# Patient Record
Sex: Female | Born: 1995 | Race: Black or African American | Hispanic: No | Marital: Single | Smoking: Current every day smoker
Health system: Southern US, Community
[De-identification: ages and names within clinical notes are randomized; demographics above are authoritative.]

## PROBLEM LIST (undated history)

## (undated) DIAGNOSIS — N6019 Diffuse cystic mastopathy of unspecified breast: Principal | ICD-10-CM

## (undated) DIAGNOSIS — Z113 Encounter for screening for infections with a predominantly sexual mode of transmission: Secondary | ICD-10-CM

## (undated) DIAGNOSIS — R51 Headache: Secondary | ICD-10-CM

## (undated) DIAGNOSIS — I10 Essential (primary) hypertension: Secondary | ICD-10-CM

## (undated) DIAGNOSIS — N898 Other specified noninflammatory disorders of vagina: Secondary | ICD-10-CM

## (undated) DIAGNOSIS — T1490XA Injury, unspecified, initial encounter: Secondary | ICD-10-CM

## (undated) DIAGNOSIS — Z3046 Encounter for surveillance of implantable subdermal contraceptive: Secondary | ICD-10-CM

## (undated) DIAGNOSIS — N926 Irregular menstruation, unspecified: Secondary | ICD-10-CM

## (undated) DIAGNOSIS — B9689 Other specified bacterial agents as the cause of diseases classified elsewhere: Secondary | ICD-10-CM

## (undated) DIAGNOSIS — N760 Acute vaginitis: Principal | ICD-10-CM

## (undated) DIAGNOSIS — Z309 Encounter for contraceptive management, unspecified: Secondary | ICD-10-CM

## (undated) DIAGNOSIS — B009 Herpesviral infection, unspecified: Secondary | ICD-10-CM

## (undated) DIAGNOSIS — N63 Unspecified lump in unspecified breast: Secondary | ICD-10-CM

## (undated) DIAGNOSIS — D249 Benign neoplasm of unspecified breast: Secondary | ICD-10-CM

## (undated) HISTORY — DX: Unspecified lump in unspecified breast: N63.0

## (undated) HISTORY — DX: Injury, unspecified, initial encounter: T14.90XA

## (undated) HISTORY — DX: Benign neoplasm of unspecified breast: D24.9

## (undated) HISTORY — DX: Irregular menstruation, unspecified: N92.6

## (undated) HISTORY — DX: Herpesviral infection, unspecified: B00.9

## (undated) HISTORY — DX: Other specified noninflammatory disorders of vagina: N89.8

## (undated) HISTORY — DX: Encounter for contraceptive management, unspecified: Z30.9

## (undated) HISTORY — PX: WISDOM TOOTH EXTRACTION: SHX21

## (undated) HISTORY — DX: Acute vaginitis: N76.0

## (undated) HISTORY — DX: Encounter for screening for infections with a predominantly sexual mode of transmission: Z11.3

## (undated) HISTORY — DX: Diffuse cystic mastopathy of unspecified breast: N60.19

## (undated) HISTORY — DX: Headache: R51

## (undated) HISTORY — DX: Other specified bacterial agents as the cause of diseases classified elsewhere: B96.89

## (undated) HISTORY — DX: Encounter for surveillance of implantable subdermal contraceptive: Z30.46

---
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
## 2013-04-06 ENCOUNTER — Encounter: Payer: Self-pay | Admitting: Adult Health

## 2013-04-06 ENCOUNTER — Encounter (INDEPENDENT_AMBULATORY_CARE_PROVIDER_SITE_OTHER): Payer: Self-pay

## 2013-04-06 ENCOUNTER — Ambulatory Visit (INDEPENDENT_AMBULATORY_CARE_PROVIDER_SITE_OTHER): Payer: BC Managed Care – PPO | Admitting: Adult Health

## 2013-04-06 VITALS — BP 100/60 | Ht 62.0 in | Wt 131.0 lb

## 2013-04-06 DIAGNOSIS — N63 Unspecified lump in unspecified breast: Secondary | ICD-10-CM | POA: Insufficient documentation

## 2013-04-06 DIAGNOSIS — N6019 Diffuse cystic mastopathy of unspecified breast: Secondary | ICD-10-CM

## 2013-04-06 DIAGNOSIS — R928 Other abnormal and inconclusive findings on diagnostic imaging of breast: Secondary | ICD-10-CM

## 2013-04-06 HISTORY — DX: Diffuse cystic mastopathy of unspecified breast: N60.19

## 2013-04-06 HISTORY — DX: Unspecified lump in unspecified breast: N63.0

## 2013-04-06 NOTE — Patient Instructions (Signed)
Fibrocystic Breast Changes Fibrocystic breast changes occur when breast ducts become blocked, causing painful, fluid-filled lumps (cysts) to form in the breast. This is a common condition that is noncancerous (benign). It occurs when women go through hormonal changes during their menstrual cycle. Fibrocystic breast changes can affect one or both breasts. CAUSES  The exact cause of fibrocystic breast changes is not known, but it may be related to the female hormones, estrogen and progesterone. Family traits that get passed from parent to child (genetics) may also be a factor in some cases. SIGNS AND SYMPTOMS   Tenderness, mild discomfort, or pain.   Swelling.   Ropelike feeling when touching the breast.   Lumpy breast, one or both sides.   Changes in breast size, especially before (larger) and after (smaller) the menstrual period.   Green or dark brown nipple discharge (not blood).  Symptoms are usually worse before menstrual periods start and get better toward the end of the menstrual period.  DIAGNOSIS  To make a diagnosis, your health care provider will ask you questions and perform a physical exam of your breasts. The health care provider may recommend other tests that can examine inside your breasts, such as:  A breast X-ray (mammogram).   Ultrasonography.  An MRI.  If something more than fibrocystic breast changes is suspected, your health care provider may take a breast tissue sample (breast biopsy) to examine. TREATMENT  Often, treatment is not needed. Your health care provider may recommend over-the-counter pain relievers to help lessen pain or discomfort caused by the fibrocystic breast changes. You may also be asked to change your diet to limit or stop eating foods or drinking beverages that contain caffeine. Foods and beverages that contain caffeine include chocolate, soda, coffee, and tea. Reducing sugar and fat in your diet may also help. Your health care provider  may also recommend:  Fine needle aspiration to remove fluid from a cyst that is causing pain.   Surgery to remove a large, persistent, and tender cyst. HOME CARE INSTRUCTIONS   Examine your breasts after every menstrual period. If you do not have menstrual periods, check your breasts the first day of every month. Feel for changes, such as more tenderness, a new growth, a change in breast size, or a change in a lump that has always been there.   Only take over-the-counter or prescription medicine as directed by your health care provider.   Wear a well-fitted support or sports bra, especially when exercising.   Decrease or avoid caffeine, fat, and sugar in your diet as directed by your health care provider.  SEEK MEDICAL CARE IF:   You have fluid leaking (discharge) from your nipples, especially bloody discharge.   You have new lumps or bumps in the breast.   Your breast or breasts become enlarged, red, and painful.   You have areas of your breast that pucker in.   Your nipples appear flat or indented.  Document Released: 10/18/2005 Document Revised: 09/03/2012 Document Reviewed: 06/22/2012 [facility] Patient Information 2014 [employer]. Recheck in 2 weeks

## 2013-04-06 NOTE — Progress Notes (Signed)
Subjective:     Patient ID: Kaitlyn Garza, female   DOB: May 22, 1995, 18 y.o.   MRN: [id number]  HPI Kaitlyn Garza is a 18 year old black female in complaining of bilateral breast knots and white vaginal discharge before period.  Review of Systems See HPI Reviewed past medical,surgical, social and family history. Reviewed medications and allergies.     Objective:   Physical Exam BP 100/60  Ht 5\' 2"  (1.575 m)  Wt 131 lb (59.421 kg)  BMI 23.95 kg/m2  LMP 04/05/2013   Skin warm and dry, has nodule at 5 o'clock right breast mobile, no retraction or nipple discharge has nipple piercing, on left has nipple piercing, no retraction or nipple discharge, but has nodule at 6 o'clock near areola, felt to be fibrocystic but will recheck after period,On pelvic exam has clitoris piercing and is on period, no discharge or odor, non tender with out masses.  Assessment:     Fibrocystic breast changes, has nodule in right and left breast    Plan:    Return in 2 weeks for recheck of breast, if persists will get [place]  Review fibrocystic changes in breast handout

## 2013-04-21 ENCOUNTER — Ambulatory Visit: Payer: BC Managed Care – PPO | Admitting: Adult Health

## 2013-04-21 ENCOUNTER — Telehealth: Payer: Self-pay | Admitting: *Deleted

## 2013-04-21 MED ORDER — METRONIDAZOLE 0.75 % VA GEL: VAGINAL | Status: DC

## 2013-04-21 MED ORDER — VALACYCLOVIR HCL 1 G PO TABS: ORAL_TABLET | ORAL | Status: DC

## 2013-04-21 NOTE — Telephone Encounter (Signed)
Pt requesting refill on two medications would not give me anymore information. Pt states wants to talk with Derrek Monaco, NP.

## 2013-04-21 NOTE — Telephone Encounter (Signed)
Needs refill on valtrex and metrogel

## 2013-09-28 ENCOUNTER — Encounter: Payer: Self-pay | Admitting: Adult Health

## 2013-09-28 ENCOUNTER — Ambulatory Visit (INDEPENDENT_AMBULATORY_CARE_PROVIDER_SITE_OTHER): Payer: BC Managed Care – PPO | Admitting: Adult Health

## 2013-09-28 VITALS — BP 130/70 | Ht 62.0 in | Wt 139.5 lb

## 2013-09-28 DIAGNOSIS — R519 Headache, unspecified: Secondary | ICD-10-CM

## 2013-09-28 DIAGNOSIS — Z113 Encounter for screening for infections with a predominantly sexual mode of transmission: Secondary | ICD-10-CM

## 2013-09-28 DIAGNOSIS — N926 Irregular menstruation, unspecified: Secondary | ICD-10-CM

## 2013-09-28 DIAGNOSIS — Z3202 Encounter for pregnancy test, result negative: Secondary | ICD-10-CM

## 2013-09-28 DIAGNOSIS — R51 Headache: Secondary | ICD-10-CM

## 2013-09-28 HISTORY — DX: Encounter for screening for infections with a predominantly sexual mode of transmission: Z11.3

## 2013-09-28 HISTORY — DX: Irregular menstruation, unspecified: N92.6

## 2013-09-28 HISTORY — DX: Headache, unspecified: R51.9

## 2013-09-28 LAB — CBC
HEMATOCRIT: 39.1 % (ref 36.0–46.0)
Hemoglobin: 13.3 g/dL (ref 12.0–15.0)
MCH: 31.1 pg (ref 26.0–34.0)
MCHC: 34 g/dL (ref 30.0–36.0)
MCV: 91.6 fL (ref 78.0–100.0)
PLATELETS: 293 10*3/uL (ref 150–400)
RBC: 4.27 MIL/uL (ref 3.87–5.11)
RDW: 13.1 % (ref 11.5–15.5)
WBC: 8.2 10*3/uL (ref 4.0–10.5)

## 2013-09-28 LAB — COMPREHENSIVE METABOLIC PANEL
ALT: 32 U/L (ref 0–35)
AST: 27 U/L (ref 0–37)
Albumin: 4.7 g/dL (ref 3.5–5.2)
Alkaline Phosphatase: 66 U/L (ref 39–117)
BILIRUBIN TOTAL: 0.8 mg/dL (ref 0.2–1.2)
BUN: 18 mg/dL (ref 6–23)
CALCIUM: 9.8 mg/dL (ref 8.4–10.5)
CHLORIDE: 102 meq/L (ref 96–112)
CO2: 27 meq/L (ref 19–32)
CREATININE: 0.9 mg/dL (ref 0.50–1.10)
GLUCOSE: 85 mg/dL (ref 70–99)
Potassium: 4 mEq/L (ref 3.5–5.3)
Sodium: 137 mEq/L (ref 135–145)
Total Protein: 7.5 g/dL (ref 6.0–8.3)

## 2013-09-28 LAB — TSH: TSH: 0.308 u[IU]/mL — AB (ref 0.350–4.500)

## 2013-09-28 LAB — POCT URINE PREGNANCY: Preg Test, Ur: NEGATIVE

## 2013-09-28 MED ORDER — NORETHIN ACE-ETH ESTRAD-FE 1-20 MG-MCG PO TABS: 1.0000 | ORAL_TABLET | Freq: Every day | ORAL | Status: DC

## 2013-09-28 NOTE — Progress Notes (Signed)
Subjective:     Patient ID: Kaitlyn Garza, female   DOB: [birth year], 18 y.o.   MRN: [id number]  HPI Kaitlyn Garza is a 18 year old female in complaining of headaches for about 2 months and irregular bleeding with nexplanon and some cramps, and she wants STD testing.She is tired all the time and has decreased libido.  Review of Systems See HPI Reviewed past medical,surgical, social and family history. Reviewed medications and allergies.     Objective:   Physical Exam BP 130/70  Ht 5\' 2"  (1.575 m)  Wt 139 lb 8 oz (63.277 kg)  BMI 25.51 kg/m2UPT negative, Skin warm and dry.Pelvic: external genitalia is normal in appearance,Has rod in, vagina:no bleeding or lesions or discharge, cervix:smooth, uterus: normal size, shape and contour, non tender, no masses felt, adnexa: no masses or tenderness noted.Marland Kitchen GC/CHL obtained. Discussed trying OC to control bleeding and check GC/CHL and check labs.Also Discussed libido with birth control and no special partner.     Assessment:     Irregular bleeding on nexplanon Headaches STD screening    Plan:     Check CBC,CMP,TSH, HIV,RPR and GC/CHL Rx Junel 1-2 take 1 daily starting today with 11 refills Follow up in 3 months

## 2013-09-28 NOTE — Patient Instructions (Signed)
Headaches, Frequently Asked Questions MIGRAINE HEADACHES Q: What is migraine? What causes it? How can I treat it? A: Generally, migraine headaches begin as a dull ache. Then they develop into a constant, throbbing, and pulsating pain. You may experience pain at the temples. You may experience pain at the front or back of one or both sides of the head. The pain is usually accompanied by a combination of:  Nausea.  Vomiting.  Sensitivity to light and noise. Some people (about 15%) experience an aura (see below) before an attack. The cause of migraine is believed to be chemical reactions in the brain. Treatment for migraine may include over-the-counter or prescription medications. It may also include self-help techniques. These include relaxation training and biofeedback.  Q: What is an aura? A: About 15% of people with migraine get an "aura". This is a sign of neurological symptoms that occur before a migraine headache. You may see wavy or jagged lines, dots, or flashing lights. You might experience tunnel vision or blind spots in one or both eyes. The aura can include visual or auditory hallucinations (something imagined). It may include disruptions in smell (such as strange odors), taste or touch. Other symptoms include:  Numbness.  A "pins and needles" sensation.  Difficulty in recalling or speaking the correct word. These neurological events may last as long as 60 minutes. These symptoms will fade as the headache begins. Q: What is a trigger? A: Certain physical or environmental factors can lead to or "trigger" a migraine. These include:  Foods.  Hormonal changes.  Weather.  Stress. It is important to remember that triggers are different for everyone. To help prevent migraine attacks, you need to figure out which triggers affect you. Keep a headache diary. This is a good way to track triggers. The diary will help you talk to your healthcare professional about your condition. Q: Does  weather affect migraines? A: Bright sunshine, hot, humid conditions, and drastic changes in barometric pressure may lead to, or "trigger," a migraine attack in some people. But studies have shown that weather does not act as a trigger for everyone with migraines. Q: What is the link between migraine and hormones? A: Hormones start and regulate many of your body's functions. Hormones keep your body in balance within a constantly changing environment. The levels of hormones in your body are unbalanced at times. Examples are during menstruation, pregnancy, or menopause. That can lead to a migraine attack. In fact, about three quarters of all women with migraine report that their attacks are related to the menstrual cycle.  Q: Is there an increased risk of stroke for migraine sufferers? A: The likelihood of a migraine attack causing a stroke is very remote. That is not to say that migraine sufferers cannot have a stroke associated with their migraines. In persons under age 40, the most common associated factor for stroke is migraine headache. But over the course of a person's normal life span, the occurrence of migraine headache may actually be associated with a reduced risk of dying from cerebrovascular disease due to stroke.  Q: What are acute medications for migraine? A: Acute medications are used to treat the pain of the headache after it has started. Examples over-the-counter medications, NSAIDs, ergots, and triptans.  Q: What are the triptans? A: Triptans are the newest class of abortive medications. They are specifically targeted to treat migraine. Triptans are vasoconstrictors. They moderate some chemical reactions in the brain. The triptans work on receptors in your brain. Triptans help   to restore the balance of a neurotransmitter called serotonin. Fluctuations in levels of serotonin are thought to be a main cause of migraine.  Q: Are over-the-counter medications for migraine effective? A:  Over-the-counter, or "OTC," medications may be effective in relieving mild to moderate pain and associated symptoms of migraine. But you should see your caregiver before beginning any treatment regimen for migraine.  Q: What are preventive medications for migraine? A: Preventive medications for migraine are sometimes referred to as "prophylactic" treatments. They are used to reduce the frequency, severity, and length of migraine attacks. Examples of preventive medications include antiepileptic medications, antidepressants, beta-blockers, calcium channel blockers, and NSAIDs (nonsteroidal anti-inflammatory drugs). Q: Why are anticonvulsants used to treat migraine? A: During the past few years, there has been an increased interest in antiepileptic drugs for the prevention of migraine. They are sometimes referred to as "anticonvulsants". Both epilepsy and migraine may be caused by similar reactions in the brain.  Q: Why are antidepressants used to treat migraine? A: Antidepressants are typically used to treat people with depression. They may reduce migraine frequency by regulating chemical levels, such as serotonin, in the brain.  Q: What alternative therapies are used to treat migraine? A: The term "alternative therapies" is often used to describe treatments considered outside the scope of conventional Western medicine. Examples of alternative therapy include acupuncture, acupressure, and yoga. Another common alternative treatment is herbal therapy. Some herbs are believed to relieve headache pain. Always discuss alternative therapies with your caregiver before proceeding. Some herbal products contain arsenic and other toxins. TENSION HEADACHES Q: What is a tension-type headache? What causes it? How can I treat it? A: Tension-type headaches occur randomly. They are often the result of temporary stress, anxiety, fatigue, or anger. Symptoms include soreness in your temples, a tightening band-like sensation  around your head (a "vice-like" ache). Symptoms can also include a pulling feeling, pressure sensations, and contracting head and neck muscles. The headache begins in your forehead, temples, or the back of your head and neck. Treatment for tension-type headache may include over-the-counter or prescription medications. Treatment may also include self-help techniques such as relaxation training and biofeedback. CLUSTER HEADACHES Q: What is a cluster headache? What causes it? How can I treat it? A: Cluster headache gets its name because the attacks come in groups. The pain arrives with little, if any, warning. It is usually on one side of the head. A tearing or bloodshot eye and a runny nose on the same side of the headache may also accompany the pain. Cluster headaches are believed to be caused by chemical reactions in the brain. They have been described as the most severe and intense of any headache type. Treatment for cluster headache includes prescription medication and oxygen. SINUS HEADACHES Q: What is a sinus headache? What causes it? How can I treat it? A: When a cavity in the bones of the face and skull (a sinus) becomes inflamed, the inflammation will cause localized pain. This condition is usually the result of an allergic reaction, a tumor, or an infection. If your headache is caused by a sinus blockage, such as an infection, you will probably have a fever. An x-ray will confirm a sinus blockage. Your caregiver's treatment might include antibiotics for the infection, as well as antihistamines or decongestants.  REBOUND HEADACHES Q: What is a rebound headache? What causes it? How can I treat it? A: A pattern of taking acute headache medications too often can lead to a condition known as "rebound headache."   A pattern of taking too much headache medication includes taking it more than 2 days per week or in excessive amounts. That means more than the label or a caregiver advises. With rebound  headaches, your medications not only stop relieving pain, they actually begin to cause headaches. Doctors treat rebound headache by tapering the medication that is being overused. Sometimes your caregiver will gradually substitute a different type of treatment or medication. Stopping may be a challenge. Regularly overusing a medication increases the potential for serious side effects. Consult a caregiver if you regularly use headache medications more than 2 days per week or more than the label advises. ADDITIONAL QUESTIONS AND ANSWERS Q: What is biofeedback? A: Biofeedback is a self-help treatment. Biofeedback uses special equipment to monitor your body's involuntary physical responses. Biofeedback monitors:  Breathing.  Pulse.  Heart rate.  Temperature.  Muscle tension.  Brain activity. Biofeedback helps you refine and perfect your relaxation exercises. You learn to control the physical responses that are related to stress. Once the technique has been mastered, you do not need the equipment any more. Q: Are headaches hereditary? A: Four out of five (80%) of people that suffer report a family history of migraine. Scientists are not sure if this is genetic or a family predisposition. Despite the uncertainty, a child has a 50% chance of having migraine if one parent suffers. The child has a 75% chance if both parents suffer.  Q: Can children get headaches? A: By the time they reach high school, most young people have experienced some type of headache. Many safe and effective approaches or medications can prevent a headache from occurring or stop it after it has begun.  Q: What type of doctor should I see to diagnose and treat my headache? A: Start with your primary caregiver. Discuss his or her experience and approach to headaches. Discuss methods of classification, diagnosis, and treatment. Your caregiver may decide to recommend you to a headache specialist, depending upon your symptoms or other  physical conditions. Having diabetes, allergies, etc., may require a more comprehensive and inclusive approach to your headache. The National Headache Foundation will provide, upon request, a list of [facility] physician members in your state. Document Released: 03/24/2003 Document Revised: 03/26/2011 Document Reviewed: 09/01/2007 [facility] Patient Information 2015 [place], [place]. This information is not intended to replace advice given to you by your health care provider. Make sure you discuss any questions you have with your health care provider. Dysfunctional Uterine Bleeding Normally, menstrual periods begin between ages 35 to 61 in young women. A normal menstrual cycle/period may begin every 23 days up to 35 days and lasts from 1 to 7 days. Around 12 to 14 days before your menstrual period starts, ovulation (ovary produces an egg) occurs. When counting the time between menstrual periods, count from the first day of bleeding of the previous period to the first day of bleeding of the next period. Dysfunctional (abnormal) uterine bleeding is bleeding that is different from a normal menstrual period. Your periods may come earlier or later than usual. They may be lighter, have blood clots or be heavier. You may have bleeding between periods, or you may skip one period or more. You may have bleeding after sexual intercourse, bleeding after menopause, or no menstrual period. CAUSES   Pregnancy (normal, miscarriage, tubal).  IUDs (intrauterine device, birth control).  Birth control pills.  Hormone treatment.  Menopause.  Infection of the cervix.  Blood clotting problems.  Infection of the inside lining of the  uterus.  Endometriosis, inside lining of the uterus growing in the pelvis and other female organs.  Adhesions (scar tissue) inside the uterus.  Obesity or severe weight loss.  Uterine polyps inside the uterus.  Cancer of the vagina, cervix, or uterus.  Ovarian cysts or polycystic ovary  syndrome.  Medical problems (diabetes, thyroid disease).  Uterine fibroids (noncancerous tumor).  Problems with your female hormones.  Endometrial hyperplasia, very thick lining and enlarged cells inside of the uterus.  Medicines that interfere with ovulation.  Radiation to the pelvis or abdomen.  Chemotherapy. DIAGNOSIS   Your doctor will discuss the history of your menstrual periods, medicines you are taking, changes in your weight, stress in your life, and any medical problems you may have.  Your doctor will do a physical and pelvic examination.  Your doctor may want to perform certain tests to make a diagnosis, such as:  Pap test.  Blood tests.  Cultures for infection.  CT scan.  Ultrasound.  Hysteroscopy.  Laparoscopy.  MRI.  Hysterosalpingography.  D and C.  Endometrial biopsy. TREATMENT  Treatment will depend on the cause of the dysfunctional uterine bleeding (DUB). Treatment may include:  Observing your menstrual periods for a couple of months.  Prescribing medicines for medical problems, including:  Antibiotics.  Hormones.  Birth control pills.  Removing an IUD (intrauterine device, birth control).  Surgery:  D and C (scrape and remove tissue from inside the uterus).  Laparoscopy (examine inside the abdomen with a lighted tube).  Uterine ablation (destroy lining of the uterus with electrical current, laser, heat, or freezing).  Hysteroscopy (examine cervix and uterus with a lighted tube).  Hysterectomy (remove the uterus). HOME CARE INSTRUCTIONS   If medicines were prescribed, take exactly as directed. Do not change or switch medicines without consulting your caregiver.  Long term heavy bleeding may result in iron deficiency. Your caregiver may have prescribed iron pills. They help replace the iron that your body lost from heavy bleeding. Take exactly as directed.  Do not take aspirin or medicines that contain aspirin one week  before or during your menstrual period. Aspirin may make the bleeding worse.  If you need to change your sanitary pad or tampon more than once every 2 hours, stay in bed with your feet elevated and a cold pack on your lower abdomen. Rest as much as possible, until the bleeding stops or slows down.  Eat well-balanced meals. Eat foods high in iron. Examples are:  Leafy green vegetables.  Whole-grain breads and cereals.  Eggs.  Meat.  Liver.  Do not try to lose weight until the abnormal bleeding has stopped and your blood iron level is back to normal. Do not lift more than ten pounds or do strenuous activities when you are bleeding.  For a couple of months, make note on your calendar, marking the start and ending of your period, and the type of bleeding (light, medium, heavy, spotting, clots or missed periods). This is for your caregiver to better evaluate your problem. SEEK MEDICAL CARE IF:   You develop nausea (feeling sick to your stomach) and vomiting, dizziness, or diarrhea while you are taking your medicine.  You are getting lightheaded or weak.  You have any problems that may be related to the medicine you are taking.  You develop pain with your DUB.  You want to remove your IUD.  You want to stop or change your birth control pills or hormones.  You have any type of abnormal bleeding mentioned  above.  You are over 49 years old and have not had a menstrual period yet.  You are 18 years old and you are still having menstrual periods.  You have any of the symptoms mentioned above.  You develop a rash. SEEK IMMEDIATE MEDICAL CARE IF:   An oral temperature above 102 F (38.9 C) develops.  You develop chills.  You are changing your sanitary pad or tampon more than once an hour.  You develop abdominal pain.  You pass out or faint. Document Released: 12/30/1999 Document Revised: 03/26/2011 Document Reviewed: 11/30/2008 [facility] Patient Information 2015 [place],  [place]. This information is not intended to replace advice given to you by your health care provider. Make sure you discuss any questions you have with your health care provider. Start OCs today Follow up in 3 months

## 2013-09-29 ENCOUNTER — Telehealth: Payer: Self-pay | Admitting: Adult Health

## 2013-09-29 LAB — RPR

## 2013-09-29 LAB — GC/CHLAMYDIA PROBE AMP
CT PROBE, AMP APTIMA: NEGATIVE
GC Probe RNA: NEGATIVE

## 2013-09-29 LAB — HIV ANTIBODY (ROUTINE TESTING W REFLEX): HIV: NONREACTIVE

## 2013-09-29 NOTE — Telephone Encounter (Signed)
Pt aware of labs  

## 2013-12-28 ENCOUNTER — Ambulatory Visit: Payer: BC Managed Care – PPO | Admitting: Adult Health

## 2014-01-06 ENCOUNTER — Telehealth: Payer: Self-pay | Admitting: Adult Health

## 2014-01-06 NOTE — Telephone Encounter (Signed)
Thinks has wart will make appt

## 2014-01-06 NOTE — Telephone Encounter (Signed)
Spoke with pt. Pt is requesting Valtrex. She started with breakout 3 days ago. Pt wants you to call her. Thanks!! [place]

## 2014-01-20 ENCOUNTER — Ambulatory Visit: Payer: BC Managed Care – PPO | Admitting: Adult Health

## 2014-01-20 ENCOUNTER — Encounter: Payer: Self-pay | Admitting: Adult Health

## 2014-01-25 ENCOUNTER — Ambulatory Visit (INDEPENDENT_AMBULATORY_CARE_PROVIDER_SITE_OTHER): Payer: 59 | Admitting: Adult Health

## 2014-01-25 ENCOUNTER — Encounter: Payer: Self-pay | Admitting: Adult Health

## 2014-01-25 VITALS — BP 112/80 | Ht 62.0 in | Wt 137.5 lb

## 2014-01-25 DIAGNOSIS — N926 Irregular menstruation, unspecified: Secondary | ICD-10-CM

## 2014-01-25 DIAGNOSIS — B9689 Other specified bacterial agents as the cause of diseases classified elsewhere: Secondary | ICD-10-CM

## 2014-01-25 DIAGNOSIS — N760 Acute vaginitis: Secondary | ICD-10-CM

## 2014-01-25 DIAGNOSIS — N898 Other specified noninflammatory disorders of vagina: Secondary | ICD-10-CM

## 2014-01-25 DIAGNOSIS — A499 Bacterial infection, unspecified: Secondary | ICD-10-CM

## 2014-01-25 HISTORY — DX: Other specified noninflammatory disorders of vagina: N89.8

## 2014-01-25 LAB — POCT WET PREP (WET MOUNT): WBC, Wet Prep HPF POC: POSITIVE

## 2014-01-25 MED ORDER — METRONIDAZOLE 500 MG PO TABS: 500.0000 mg | ORAL_TABLET | Freq: Two times a day (BID) | ORAL | Status: DC

## 2014-01-25 NOTE — Progress Notes (Signed)
Subjective:     Patient ID: Kaitlyn Garza, female   DOB: 02-14-1995, 19 y.o.   MRN: [id number]  HPI Gwenetta is a 19 year old mixed female in complaining of  Vaginal discharge and having 3 periods in December, has nexplanon.She wants nexplanon removed.  Review of Systems See HPI Reviewed past medical,surgical, social and family history. Reviewed medications and allergies.     Objective:   Physical Exam BP 112/80 mmHg  Ht 5\' 2"  (1.575 m)  Wt 137 lb 8 oz (62.37 kg)  BMI 25.14 kg/m2  LMP 01/11/2014   Skin warm and dry.Pelvic: external genitalia is normal in appearance, vagina: white discharge with odor, cervix:smooth, uterus: normal size, shape and contour, non tender, no masses felt, adnexa: no masses or tenderness noted. Wet prep: + for clue cells and +WBCs. GC/CHL obtained.  Discussed birth control options and she thinks she wants the patch,  Assessment:     Vaginal discharge BV Irregular menses with nexplanon    Plan:    GC/CHL sent Rx flagyl 500 mg 1 bid x 7 days, no alcohol, review handout on BV   No sex during treatment No thongs or tub baths Return in 1 week for nexplanon removal and she thinks she wants the patch

## 2014-01-25 NOTE — Patient Instructions (Addendum)
Bacterial Vaginosis Bacterial vaginosis is a vaginal infection that occurs when the normal balance of bacteria in the vagina is disrupted. It results from an overgrowth of certain bacteria. This is the most common vaginal infection in women of childbearing age. Treatment is important to prevent complications, especially in pregnant women, as it can cause a premature delivery. CAUSES  Bacterial vaginosis is caused by an increase in harmful bacteria that are normally present in smaller amounts in the vagina. Several different kinds of bacteria can cause bacterial vaginosis. However, the reason that the condition develops is not fully understood. RISK FACTORS Certain activities or behaviors can put you at an increased risk of developing bacterial vaginosis, including:  Having a new sex partner or multiple sex partners.  Douching.  Using an intrauterine device (IUD) for contraception. Women do not get bacterial vaginosis from toilet seats, bedding, swimming pools, or contact with objects around them. SIGNS AND SYMPTOMS  Some women with bacterial vaginosis have no signs or symptoms. Common symptoms include:  Grey vaginal discharge.  A fishlike odor with discharge, especially after sexual intercourse.  Itching or burning of the vagina and vulva.  Burning or pain with urination. DIAGNOSIS  Your health care provider will take a medical history and examine the vagina for signs of bacterial vaginosis. A sample of vaginal fluid may be taken. Your health care provider will look at this sample under a microscope to check for bacteria and abnormal cells. A vaginal pH test may also be done.  TREATMENT  Bacterial vaginosis may be treated with antibiotic medicines. These may be given in the form of a pill or a vaginal cream. A second round of antibiotics may be prescribed if the condition comes back after treatment.  HOME CARE INSTRUCTIONS   Only take over-the-counter or prescription medicines as  directed by your health care provider.  If antibiotic medicine was prescribed, take it as directed. Make sure you finish it even if you start to feel better.  Do not have sex until treatment is completed.  Tell all sexual partners that you have a vaginal infection. They should see their health care provider and be treated if they have problems, such as a mild rash or itching.  Practice safe sex by using condoms and only having one sex partner. SEEK MEDICAL CARE IF:   Your symptoms are not improving after 3 days of treatment.  You have increased discharge or pain.  You have a fever. MAKE SURE YOU:   Understand these instructions.  Will watch your condition.  Will get help right away if you are not doing well or get worse. FOR MORE INFORMATION  Centers for Disease Control and Prevention, Division of STD Prevention: [employer]raiser[employer]raud[employer] American Sexual Health Association (ASHA): www.ashastd.org  Document Released: 01/01/2005 Document Revised: 10/22/2012 Document Reviewed: 08/13/2012 [facility] Patient Information 2015 [place], [place]. This information is not intended to replace advice given to you by your health care provider. Make sure you discuss any questions you have with your health care provider. Return in 1 week for nexplanon removal No sex or alcohol during treatment No thongs or tub baths

## 2014-01-26 LAB — GC/CHLAMYDIA PROBE AMP
CT PROBE, AMP APTIMA: NEGATIVE
GC PROBE AMP APTIMA: NEGATIVE

## 2014-02-02 ENCOUNTER — Encounter: Payer: Self-pay | Admitting: Adult Health

## 2014-02-03 ENCOUNTER — Encounter: Payer: Self-pay | Admitting: Adult Health

## 2014-02-03 ENCOUNTER — Ambulatory Visit (INDEPENDENT_AMBULATORY_CARE_PROVIDER_SITE_OTHER): Payer: 59 | Admitting: Adult Health

## 2014-02-03 VITALS — BP 110/82 | Ht 62.0 in | Wt 141.5 lb

## 2014-02-03 DIAGNOSIS — Z30018 Encounter for initial prescription of other contraceptives: Secondary | ICD-10-CM

## 2014-02-03 DIAGNOSIS — Z3046 Encounter for surveillance of implantable subdermal contraceptive: Secondary | ICD-10-CM

## 2014-02-03 DIAGNOSIS — Z3202 Encounter for pregnancy test, result negative: Secondary | ICD-10-CM

## 2014-02-03 DIAGNOSIS — Z3049 Encounter for surveillance of other contraceptives: Secondary | ICD-10-CM

## 2014-02-03 DIAGNOSIS — Z309 Encounter for contraceptive management, unspecified: Secondary | ICD-10-CM | POA: Insufficient documentation

## 2014-02-03 HISTORY — DX: Encounter for contraceptive management, unspecified: Z30.9

## 2014-02-03 HISTORY — DX: Encounter for surveillance of implantable subdermal contraceptive: Z30.46

## 2014-02-03 LAB — POCT URINE PREGNANCY: Preg Test, Ur: NEGATIVE

## 2014-02-03 MED ORDER — NORELGESTROMIN-ETH ESTRADIOL 150-35 MCG/24HR TD PTWK: 1.0000 | MEDICATED_PATCH | TRANSDERMAL | Status: DC

## 2014-02-03 NOTE — Progress Notes (Signed)
Subjective:     Patient ID: Charlies Silvers, female   DOB: [birth year], 19 y.o.   MRN: [id number]  HPI Janette is a 19 year old mixed female in for nexplanon removal and get on ortho evra, has vaginal dryness.  Review of Systems See HPI Reviewed past medical,surgical, social and family history. Reviewed medications and allergies.      Objective:   Physical Exam BP 110/82 mmHg  Ht 5\' 2"  (1.575 m)  Wt 141 lb 8 oz (64.184 kg)  BMI 25.87 kg/m2  LMP 01/11/2014   UPT negative, consent signed, time out called, left arm cleansed with betadine, and injected with 1.5 cc 2% lidocaine and waited til numb.Under sterile technique a #11 blade was used to make small vertical incision, and a curved forceps was used to remove rod. Steri strips applied. Pressure dressing applied.  Assessment:     Nexplanon removal Contraceptive management    Plan:    Note given for school today Use condoms x 4 weeks, keep clean and dry x 24 hours, no heavy lifting, keep steri strips on x 72 hours, Keep pressure dressing on x 24 hours. Follow up prn problems.   Rx ortho evra patch #3 with 12 refills, put patch on today, use 1 per week Return in 2 months for pap and physical Try luvena and astrogilde,increase foreplay

## 2014-02-03 NOTE — Patient Instructions (Signed)
Put patch on today Use condoms x 1 month Try astroglide for sex Try luvena Return in 2 months for pap and physical Use condoms x 4 weeks, keep clean and dry x 24 hours, no heavy lifting, keep steri strips on x 72 hours, Keep pressure dressing on x 24 hours. Follow up prn problems.

## 2014-03-22 ENCOUNTER — Telehealth: Payer: Self-pay | Admitting: *Deleted

## 2014-03-22 NOTE — Telephone Encounter (Signed)
Spoke with pt. Pt states she has gained a lot of weight with the patch. Wants to discuss birth control options. Advised she would need an appt. Pt voiced understanding. Call transferred to front desk for appt. [place]

## 2014-03-25 ENCOUNTER — Ambulatory Visit (INDEPENDENT_AMBULATORY_CARE_PROVIDER_SITE_OTHER): Payer: 59 | Admitting: Adult Health

## 2014-03-25 ENCOUNTER — Encounter: Payer: Self-pay | Admitting: Adult Health

## 2014-03-25 VITALS — BP 132/80 | HR 74 | Ht 62.0 in | Wt 139.5 lb

## 2014-03-25 DIAGNOSIS — Z30018 Encounter for initial prescription of other contraceptives: Secondary | ICD-10-CM

## 2014-03-25 MED ORDER — ETONOGESTREL-ETHINYL ESTRADIOL 0.12-0.015 MG/24HR VA RING: VAGINAL_RING | VAGINAL | Status: DC

## 2014-03-25 NOTE — Progress Notes (Signed)
Subjective:     Patient ID: Kaitlyn Garza, female   DOB: [birth year], 19 y.o.   MRN: [id number]  HPI Kaitlyn Garza is a 19 year old biracial female in complaining of weight gain on the patch wants different birth control.  Review of Systems  +weight gain, all other systems negative.  Reviewed past medical,surgical, social and family history. Reviewed medications and allergies.     Objective:   Physical Exam BP 132/80 mmHg  Pulse 74  Ht 5\' 2"  (1.575 m)  Wt 139 lb 8 oz (63.277 kg)  BMI 25.51 kg/m2  LMP 02/25/2014    Talk only, discussed IUD and nuva ring and she wants to try the nuva ring.  Assessment:     Contraceptive management    Plan:     Rx nuva ring disp 1 ring with 11 refills, can put in Sunday 1 sample given [id number] Follow up prn Use condoms

## 2014-03-25 NOTE — Patient Instructions (Signed)

## 2014-04-05 ENCOUNTER — Other Ambulatory Visit: Payer: 59 | Admitting: Adult Health

## 2014-04-05 ENCOUNTER — Encounter: Payer: Self-pay | Admitting: *Deleted

## 2014-04-12 ENCOUNTER — Telehealth: Payer: Self-pay | Admitting: Adult Health

## 2014-04-12 NOTE — Telephone Encounter (Signed)
Pulled nuva ring out with tampon and flushed it has been without it a week go to pharmacy and get one and insert now, not having sex at present.

## 2014-05-12 ENCOUNTER — Telehealth: Payer: Self-pay | Admitting: *Deleted

## 2014-05-12 NOTE — Telephone Encounter (Signed)
Spoke with pt. I advised she can call [facility] in [place] and have them transfer [facility] [place]. Pt voiced understanding. [place]

## 2014-06-24 ENCOUNTER — Emergency Department (HOSPITAL_COMMUNITY)
Admission: EM | Admit: 2014-06-24 | Discharge: 2014-06-24 | Disposition: A | Discharge status: Home / Self Care | Payer: 59 | Attending: Emergency Medicine | Admitting: Emergency Medicine

## 2014-06-24 ENCOUNTER — Encounter (HOSPITAL_COMMUNITY): Payer: Self-pay

## 2014-06-24 ENCOUNTER — Emergency Department (HOSPITAL_COMMUNITY): Payer: 59

## 2014-06-24 DIAGNOSIS — Z79899 Other long term (current) drug therapy: Secondary | ICD-10-CM | POA: Diagnosis not present

## 2014-06-24 DIAGNOSIS — Z793 Long term (current) use of hormonal contraceptives: Secondary | ICD-10-CM | POA: Diagnosis not present

## 2014-06-24 DIAGNOSIS — Z8742 Personal history of other diseases of the female genital tract: Secondary | ICD-10-CM | POA: Diagnosis not present

## 2014-06-24 DIAGNOSIS — R51 Headache: Secondary | ICD-10-CM | POA: Diagnosis not present

## 2014-06-24 DIAGNOSIS — R519 Headache, unspecified: Secondary | ICD-10-CM

## 2014-06-24 DIAGNOSIS — Z720 Tobacco use: Secondary | ICD-10-CM | POA: Diagnosis not present

## 2014-06-24 DIAGNOSIS — Z86018 Personal history of other benign neoplasm: Secondary | ICD-10-CM | POA: Insufficient documentation

## 2014-06-24 DIAGNOSIS — Z8619 Personal history of other infectious and parasitic diseases: Secondary | ICD-10-CM | POA: Insufficient documentation

## 2014-06-24 MED ORDER — DIPHENHYDRAMINE HCL 50 MG/ML IJ SOLN
25.0000 mg | Freq: Once | INTRAMUSCULAR | Status: AC
  Administered 2014-06-24: 25 mg via INTRAVENOUS
  Filled 2014-06-24: qty 1

## 2014-06-24 MED ORDER — METHOCARBAMOL 500 MG PO TABS: ORAL_TABLET | ORAL | Status: DC

## 2014-06-24 MED ORDER — NAPROXEN 500 MG PO TABS: ORAL_TABLET | ORAL | Status: DC

## 2014-06-24 MED ORDER — METOCLOPRAMIDE HCL 5 MG/ML IJ SOLN
10.0000 mg | Freq: Once | INTRAMUSCULAR | Status: AC
  Administered 2014-06-24: 10 mg via INTRAVENOUS
  Filled 2014-06-24: qty 2

## 2014-06-24 MED ORDER — DEXAMETHASONE SODIUM PHOSPHATE 4 MG/ML IJ SOLN
10.0000 mg | Freq: Once | INTRAMUSCULAR | Status: AC
  Administered 2014-06-24: 10 mg via INTRAVENOUS
  Filled 2014-06-24: qty 3

## 2014-06-24 MED ORDER — SODIUM CHLORIDE 0.9 % IV BOLUS (SEPSIS)
1000.0000 mL | Freq: Once | INTRAVENOUS | Status: AC
  Administered 2014-06-24: 1000 mL via INTRAVENOUS

## 2014-06-24 MED ORDER — ISOMETHEPTENE-DICHLORAL-APAP 65-100-325 MG PO CAPS: 1.0000 | ORAL_CAPSULE | Freq: Four times a day (QID) | ORAL | Status: DC | PRN
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
## 2014-06-24 NOTE — Discharge Instructions (Signed)
Drink plenty of fluids. Take the medications as prescribed if needed.    Recurrent Migraine Headache A migraine headache is an intense, throbbing pain on one or both sides of your head. Recurrent migraines keep coming back. A migraine can last for 30 minutes to several hours. CAUSES  The exact cause of a migraine headache is not always known. However, a migraine may be caused when nerves in the brain become irritated and release chemicals that cause inflammation. This causes pain. Certain things may also trigger migraines, such as:   Alcohol.  Smoking.  Stress.  Menstruation.  Aged cheeses.  Foods or drinks that contain nitrates, glutamate, aspartame, or tyramine.  Lack of sleep.  Chocolate.  Caffeine.  Hunger.  Physical exertion.  Fatigue.  Medicines used to treat chest pain (nitroglycerine), birth control pills, estrogen, and some blood pressure medicines. SYMPTOMS   Pain on one or both sides of your head.  Pulsating or throbbing pain.  Severe pain that prevents daily activities.  Pain that is aggravated by any physical activity.  Nausea, vomiting, or both.  Dizziness.  Pain with exposure to bright lights, loud noises, or activity.  General sensitivity to bright lights, loud noises, or smells. Before you get a migraine, you may get warning signs that a migraine is coming (aura). An aura may include:  Seeing flashing lights.  Seeing bright spots, halos, or zigzag lines.  Having tunnel vision or blurred vision.  Having feelings of numbness or tingling.  Having trouble talking.  Having muscle weakness. DIAGNOSIS  A recurrent migraine headache is often diagnosed based on:  Symptoms.  Physical examination.  A CT scan or MRI of your head. These imaging tests cannot diagnose migraines but can help rule out other causes of headaches.  TREATMENT  Medicines may be given for pain and nausea. Medicines can also be given to help prevent recurrent  migraines. HOME CARE INSTRUCTIONS  Only take over-the-counter or prescription medicines for pain or discomfort as directed by your health care provider. The use of long-term narcotics is not recommended.  Lie down in a dark, quiet room when you have a migraine.  Keep a journal to find out what may trigger your migraine headaches. For example, write down:  What you eat and drink.  How much sleep you get.  Any change to your diet or medicines.  Limit alcohol consumption.  Quit smoking if you smoke.  Get 7-9 hours of sleep, or as recommended by your health care provider.  Limit stress.  Keep lights dim if bright lights bother you and make your migraines worse. SEEK MEDICAL CARE IF:   You do not get relief from the medicines given to you.  You have a recurrence of pain.  You have a fever. SEEK IMMEDIATE MEDICAL CARE IF:  Your migraine becomes severe.  You have a stiff neck.  You have loss of vision.  You have muscular weakness or loss of muscle control.  You start losing your balance or have trouble walking.  You feel faint or pass out.  You have severe symptoms that are different from your first symptoms. MAKE SURE YOU:   Understand these instructions.  Will watch your condition.  Will get help right away if you are not doing well or get worse. Document Released: 09/26/2000 Document Revised: 05/18/2013 Document Reviewed: 09/08/2012 [facility] Patient Information 2015 [place], [place]. This information is not intended to replace advice given to you by your health care provider. Make sure you discuss any questions you have  with your health care provider. ° °

## 2014-06-24 NOTE — ED Notes (Signed)
Pt c/o constant headache x2 days and "has not been feeling good" for 4 days. Pt c/o nausea. Pt reports trying excedrin, tylenol, and advil with no relief. Sensitivity to light and sound. A&o x4. NAD. VSS

## 2014-08-30 ENCOUNTER — Other Ambulatory Visit: Payer: Self-pay | Admitting: Adult Health

## 2014-10-04 ENCOUNTER — Telehealth: Payer: Self-pay | Admitting: *Deleted

## 2014-10-04 NOTE — Telephone Encounter (Signed)
Spoke with pt. Pt states she left NuvaRing out 1 week and a few days longer than it should have been left out. Pt had sex Friday. I spoke with Maudie Mercury, CNM and she recommends to go ahead and put ring in when she gets it because it will not hurt anything if she is pregnant. Pt needs to take a pregnancy test. Will not show up for another 2 weeks if she got pregnant Friday. Pt advised to use a condom for 2 weeks for backup. Pt voiced understanding. [place]

## 2015-01-02 ENCOUNTER — Encounter: Payer: Self-pay | Admitting: "Endocrinology

## 2015-01-11 ENCOUNTER — Telehealth: Payer: Self-pay | Admitting: Adult Health

## 2015-01-11 NOTE — Telephone Encounter (Signed)
Pt requesting refill for the Flagyl for BV. Pt states seen Derrek Monaco, NP in the past was seen and treated for BV, symptoms back. Pt offered an appt with another provider due to Anderson Malta being out of the office until next week. Pt requesting message to be sent to [facility] asking would she refill without an appt pt leaving to go out of town.

## 2015-01-15 MED ORDER — METRONIDAZOLE 500 MG PO TABS: 500.0000 mg | ORAL_TABLET | Freq: Two times a day (BID) | ORAL | Status: DC

## 2015-01-15 NOTE — Telephone Encounter (Signed)
Left message will rx flagyl

## 2015-03-14 NOTE — Telephone Encounter (Signed)
This encounter was created in error - please disregard.

## 2015-05-05 ENCOUNTER — Other Ambulatory Visit: Payer: Self-pay | Admitting: Adult Health

## 2015-08-30 ENCOUNTER — Ambulatory Visit (HOSPITAL_COMMUNITY)
Admission: EM | Admit: 2015-08-30 | Discharge: 2015-08-30 | Disposition: A | Discharge status: Nursing Home (Includes ICF) | Payer: 59 | Attending: Family Medicine | Admitting: Family Medicine

## 2015-08-30 ENCOUNTER — Encounter (HOSPITAL_COMMUNITY): Payer: Self-pay | Admitting: *Deleted

## 2015-08-30 ENCOUNTER — Encounter (HOSPITAL_COMMUNITY): Payer: Self-pay | Admitting: Emergency Medicine

## 2015-08-30 DIAGNOSIS — R1031 Right lower quadrant pain: Secondary | ICD-10-CM | POA: Diagnosis not present

## 2015-08-30 DIAGNOSIS — F17210 Nicotine dependence, cigarettes, uncomplicated: Secondary | ICD-10-CM | POA: Diagnosis not present

## 2015-08-30 DIAGNOSIS — K2980 Duodenitis without bleeding: Secondary | ICD-10-CM | POA: Diagnosis not present

## 2015-08-30 DIAGNOSIS — R112 Nausea with vomiting, unspecified: Secondary | ICD-10-CM

## 2015-08-30 DIAGNOSIS — N898 Other specified noninflammatory disorders of vagina: Secondary | ICD-10-CM | POA: Insufficient documentation

## 2015-08-30 DIAGNOSIS — R1013 Epigastric pain: Secondary | ICD-10-CM | POA: Diagnosis present

## 2015-08-30 LAB — URINALYSIS, ROUTINE W REFLEX MICROSCOPIC
Bilirubin Urine: NEGATIVE
Glucose, UA: NEGATIVE mg/dL
HGB URINE DIPSTICK: NEGATIVE
Ketones, ur: 15 mg/dL — AB
LEUKOCYTES UA: NEGATIVE
Nitrite: NEGATIVE
PROTEIN: NEGATIVE mg/dL
SPECIFIC GRAVITY, URINE: 1.029 (ref 1.005–1.030)
pH: 5.5 (ref 5.0–8.0)

## 2015-08-30 LAB — COMPREHENSIVE METABOLIC PANEL
ALK PHOS: 74 U/L (ref 38–126)
ALT: 16 U/L (ref 14–54)
ANION GAP: 11 (ref 5–15)
AST: 22 U/L (ref 15–41)
Albumin: 4.5 g/dL (ref 3.5–5.0)
BUN: 9 mg/dL (ref 6–20)
CO2: 23 mmol/L (ref 22–32)
CREATININE: 0.88 mg/dL (ref 0.44–1.00)
Calcium: 9.7 mg/dL (ref 8.9–10.3)
Chloride: 104 mmol/L (ref 101–111)
Glucose, Bld: 91 mg/dL (ref 65–99)
Potassium: 3.7 mmol/L (ref 3.5–5.1)
Sodium: 138 mmol/L (ref 135–145)
Total Bilirubin: 0.8 mg/dL (ref 0.3–1.2)
Total Protein: 7.6 g/dL (ref 6.5–8.1)

## 2015-08-30 LAB — CBC
HEMATOCRIT: 41 % (ref 36.0–46.0)
Hemoglobin: 13.7 g/dL (ref 12.0–15.0)
MCH: 30.9 pg (ref 26.0–34.0)
MCHC: 33.4 g/dL (ref 30.0–36.0)
MCV: 92.3 fL (ref 78.0–100.0)
Platelets: 276 10*3/uL (ref 150–400)
RBC: 4.44 MIL/uL (ref 3.87–5.11)
RDW: 13.1 % (ref 11.5–15.5)
WBC: 8.8 10*3/uL (ref 4.0–10.5)

## 2015-08-30 LAB — LIPASE, BLOOD: LIPASE: 21 U/L (ref 11–51)

## 2015-08-30 LAB — I-STAT BETA HCG BLOOD, ED (MC, WL, AP ONLY)

## 2015-08-30 MED ORDER — DIATRIZOATE MEGLUMINE & SODIUM 66-10 % PO SOLN
ORAL | Status: DC
Start: 2015-08-30 — End: 2015-08-31
  Filled 2015-08-30: qty 30

## 2015-08-30 MED ORDER — OXYCODONE-ACETAMINOPHEN 5-325 MG PO TABS
ORAL_TABLET | ORAL | Status: AC
  Filled 2015-08-30: qty 1

## 2015-08-30 MED ORDER — ONDANSETRON 4 MG PO TBDP: 4.0000 mg | ORAL_TABLET | Freq: Once | ORAL | Status: DC | PRN

## 2015-08-30 MED ORDER — OXYCODONE-ACETAMINOPHEN 5-325 MG PO TABS
1.0000 | ORAL_TABLET | Freq: Once | ORAL | Status: AC
  Administered 2015-08-30: 1 via ORAL

## 2015-08-30 MED ORDER — IOPAMIDOL (ISOVUE-300) INJECTION 61%
INTRAVENOUS | Status: AC
  Administered 2015-08-31: 100 mL
  Filled 2015-08-30: qty 100

## 2015-08-30 NOTE — ED Notes (Signed)
Per Dr. Johnney Killian: ok to order CT abdomen/pelvis. Per CT: pt will get oral contrast in waiting room and then IV started in scanner.
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
## 2015-08-30 NOTE — ED Notes (Signed)
Pt lying in lobby chair crying and c/o severe 9/10 RLQ abdominal pain. Will medicate patient with protocol orders.

## 2015-08-30 NOTE — ED Triage Notes (Signed)
Pt c/o abdominal pain n/v since Sunday. Pt was seen in ER in [place] Sunday night, pt was given nausea medicine and fluids. Pt states abdominal pain started middle of abdomen now RLQ. Pt was seen at [facility] then sent to ED for appy workup. Pt rebound tenderness.

## 2015-08-30 NOTE — ED Triage Notes (Signed)
The patient presented to the [facility] with a complaint of abdominal pain that started 4 days ago as epigastric and has now radiated to the RLQ. The patient stated that she has N/V as well. The patient's RLQ was tender to palpation and positive for rebound tenderness at the McBurney's point.

## 2015-08-30 NOTE — ED Notes (Signed)
Patient report called to Lexine Baton, RN at [facility] ED First Rn

## 2015-08-31 ENCOUNTER — Encounter (HOSPITAL_COMMUNITY): Payer: Self-pay | Admitting: Radiology

## 2015-08-31 ENCOUNTER — Emergency Department (HOSPITAL_COMMUNITY)
Admission: EM | Admit: 2015-08-31 | Discharge: 2015-08-31 | Disposition: A | Discharge status: Home / Self Care | Payer: 59 | Attending: Emergency Medicine | Admitting: Emergency Medicine

## 2015-08-31 ENCOUNTER — Emergency Department (HOSPITAL_COMMUNITY): Payer: 59

## 2015-08-31 DIAGNOSIS — K2980 Duodenitis without bleeding: Secondary | ICD-10-CM

## 2015-08-31 DIAGNOSIS — R109 Unspecified abdominal pain: Secondary | ICD-10-CM

## 2015-08-31 MED ORDER — OMEPRAZOLE 20 MG PO CPDR: 20.0000 mg | DELAYED_RELEASE_CAPSULE | Freq: Every day | ORAL | 0 refills | Status: DC

## 2015-08-31 MED ORDER — ONDANSETRON 4 MG PO TBDP: 4.0000 mg | ORAL_TABLET | Freq: Three times a day (TID) | ORAL | 0 refills | Status: DC | PRN

## 2015-08-31 MED ORDER — DICYCLOMINE HCL 20 MG PO TABS: 20.0000 mg | ORAL_TABLET | Freq: Two times a day (BID) | ORAL | 0 refills | Status: DC

## 2015-08-31 MED ORDER — DICYCLOMINE HCL 10 MG PO CAPS
10.0000 mg | ORAL_CAPSULE | Freq: Once | ORAL | Status: AC
  Administered 2015-08-31: 10 mg via ORAL
  Filled 2015-08-31: qty 1

## 2015-08-31 NOTE — ED Notes (Addendum)
Patient transported to CT 

## 2015-08-31 NOTE — Discharge Instructions (Signed)
Please read and follow all provided instructions.  Your diagnoses today include:  1. Duodenitis   2. Abdominal pain     Tests performed today include:  Blood counts and electrolytes  Blood tests to check liver and kidney function  Blood tests to check pancreas function  Urine test to look for infection and pregnancy (in women)  CT scan - shows normal appendix, inflammation of duodenum (small intestine)  Vital signs. See below for your results today.   Medications prescribed:   Bentyl - medication for intestinal cramps and spasms   Zofran (ondansetron) - for nausea and vomiting   Omeprazole (Prilosec) - stomach acid reducer  This medication can be found over-the-counter  Take any prescribed medications only as directed.  Home care instructions:   Follow any educational materials contained in this packet.  Follow-up instructions: Please follow-up with your primary care provider in the next 3 days for further evaluation of your symptoms if not improved.    Return instructions:  SEEK IMMEDIATE MEDICAL ATTENTION IF:  The pain does not go away or becomes severe   A temperature above 101F develops   Repeated vomiting occurs (multiple episodes)   The pain becomes localized to portions of the abdomen. The right side could possibly be appendicitis. In an adult, the left lower portion of the abdomen could be colitis or diverticulitis.   Blood is being passed in stools or vomit (bright red or black tarry stools)   You develop chest pain, difficulty breathing, dizziness or fainting, or become confused, poorly responsive, or inconsolable (young children)  If you have any other emergent concerns regarding your health  Additional Information: Abdominal (belly) pain can be caused by many things. Your caregiver performed an examination and possibly ordered blood/urine tests and imaging (CT scan, x-rays, ultrasound). Many cases can be observed and treated at home after  initial evaluation in the emergency department. Even though you are being discharged home, abdominal pain can be unpredictable. Therefore, you need a repeated exam if your pain does not resolve, returns, or worsens. Most patients with abdominal pain don't have to be admitted to the hospital or have surgery, but serious problems like appendicitis and gallbladder attacks can start out as nonspecific pain. Many abdominal conditions cannot be diagnosed in one visit, so follow-up evaluations are very important.  Your vital signs today were: BP 134/99 (BP Location: Left Arm)    Pulse 62    Temp 99.1 F (37.3 C) (Oral)    Resp 16    Ht 5\' 2"  (1.575 m)    Wt 56.7 kg    LMP 08/07/2015    SpO2 99%    BMI 22.86 kg/m  If your blood pressure (bp) was elevated above 135/85 this visit, please have this repeated by your doctor within one month. --------------
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
## 2015-09-01 MED ORDER — IOPAMIDOL (ISOVUE-300) INJECTION 61%
100.0000 mL | Freq: Once | INTRAVENOUS | Status: AC | PRN
  Administered 2015-08-31: 100 mL via INTRAVENOUS

## 2015-09-05 ENCOUNTER — Ambulatory Visit (INDEPENDENT_AMBULATORY_CARE_PROVIDER_SITE_OTHER): Payer: 59 | Admitting: Gastroenterology

## 2015-09-05 ENCOUNTER — Encounter (HOSPITAL_COMMUNITY): Payer: Self-pay | Admitting: Internal Medicine

## 2015-09-05 ENCOUNTER — Encounter: Payer: Self-pay | Admitting: Gastroenterology

## 2015-09-05 ENCOUNTER — Inpatient Hospital Stay (HOSPITAL_COMMUNITY)
Admission: AD | Admit: 2015-09-05 | Discharge: 2015-09-08 | DRG: 392 | Disposition: A | Discharge status: Home / Self Care | Payer: 59 | Source: Ambulatory Visit | Attending: Internal Medicine | Admitting: Internal Medicine

## 2015-09-05 DIAGNOSIS — K2970 Gastritis, unspecified, without bleeding: Secondary | ICD-10-CM | POA: Diagnosis present

## 2015-09-05 DIAGNOSIS — Z720 Tobacco use: Secondary | ICD-10-CM | POA: Diagnosis present

## 2015-09-05 DIAGNOSIS — R1013 Epigastric pain: Secondary | ICD-10-CM | POA: Diagnosis present

## 2015-09-05 DIAGNOSIS — F17210 Nicotine dependence, cigarettes, uncomplicated: Secondary | ICD-10-CM | POA: Diagnosis present

## 2015-09-05 DIAGNOSIS — A048 Other specified bacterial intestinal infections: Secondary | ICD-10-CM | POA: Diagnosis present

## 2015-09-05 DIAGNOSIS — K2980 Duodenitis without bleeding: Secondary | ICD-10-CM | POA: Insufficient documentation

## 2015-09-05 DIAGNOSIS — Z803 Family history of malignant neoplasm of breast: Secondary | ICD-10-CM | POA: Diagnosis not present

## 2015-09-05 DIAGNOSIS — E872 Acidosis: Secondary | ICD-10-CM | POA: Diagnosis present

## 2015-09-05 DIAGNOSIS — R112 Nausea with vomiting, unspecified: Secondary | ICD-10-CM | POA: Diagnosis present

## 2015-09-05 DIAGNOSIS — Z823 Family history of stroke: Secondary | ICD-10-CM

## 2015-09-05 DIAGNOSIS — Z8249 Family history of ischemic heart disease and other diseases of the circulatory system: Secondary | ICD-10-CM | POA: Diagnosis not present

## 2015-09-05 DIAGNOSIS — E876 Hypokalemia: Secondary | ICD-10-CM | POA: Diagnosis present

## 2015-09-05 DIAGNOSIS — B9681 Helicobacter pylori [H. pylori] as the cause of diseases classified elsewhere: Secondary | ICD-10-CM | POA: Diagnosis present

## 2015-09-05 LAB — CBC WITH DIFFERENTIAL/PLATELET
BASOS ABS: 0 10*3/uL (ref 0.0–0.1)
BASOS PCT: 0 %
EOS PCT: 0 %
Eosinophils Absolute: 0 10*3/uL (ref 0.0–0.7)
HEMATOCRIT: 40.9 % (ref 36.0–46.0)
Hemoglobin: 14 g/dL (ref 12.0–15.0)
Lymphocytes Relative: 11 %
Lymphs Abs: 0.9 10*3/uL (ref 0.7–4.0)
MCH: 31 pg (ref 26.0–34.0)
MCHC: 34.2 g/dL (ref 30.0–36.0)
MCV: 90.5 fL (ref 78.0–100.0)
MONO ABS: 0.2 10*3/uL (ref 0.1–1.0)
MONOS PCT: 2 %
NEUTROS ABS: 7.5 10*3/uL (ref 1.7–7.7)
Neutrophils Relative %: 87 %
PLATELETS: 265 10*3/uL (ref 150–400)
RBC: 4.52 MIL/uL (ref 3.87–5.11)
RDW: 13.2 % (ref 11.5–15.5)
WBC: 8.7 10*3/uL (ref 4.0–10.5)

## 2015-09-05 LAB — COMPREHENSIVE METABOLIC PANEL
ALBUMIN: 4.6 g/dL (ref 3.5–5.0)
ALK PHOS: 73 U/L (ref 38–126)
ALT: 14 U/L (ref 14–54)
AST: 18 U/L (ref 15–41)
Anion gap: 6 (ref 5–15)
BILIRUBIN TOTAL: 0.6 mg/dL (ref 0.3–1.2)
BUN: 11 mg/dL (ref 6–20)
CO2: 18 mmol/L — ABNORMAL LOW (ref 22–32)
CREATININE: 0.72 mg/dL (ref 0.44–1.00)
Calcium: 8.9 mg/dL (ref 8.9–10.3)
Chloride: 110 mmol/L (ref 101–111)
GFR calc Af Amer: >60 mL/min (ref >60)
GLUCOSE: 113 mg/dL — AB (ref 65–99)
Potassium: 3.4 mmol/L — ABNORMAL LOW (ref 3.5–5.1)
Sodium: 134 mmol/L — ABNORMAL LOW (ref 135–145)
TOTAL PROTEIN: 7.9 g/dL (ref 6.5–8.1)

## 2015-09-05 LAB — URINALYSIS, ROUTINE W REFLEX MICROSCOPIC
BILIRUBIN URINE: NEGATIVE
Glucose, UA: NEGATIVE mg/dL
HGB URINE DIPSTICK: NEGATIVE
KETONES UR: NEGATIVE mg/dL
Leukocytes, UA: NEGATIVE
Nitrite: NEGATIVE
PROTEIN: NEGATIVE mg/dL
Specific Gravity, Urine: 1.015 (ref 1.005–1.030)
pH: 7 (ref 5.0–8.0)

## 2015-09-05 LAB — LIPASE, BLOOD: LIPASE: 26 U/L (ref 11–51)

## 2015-09-05 LAB — MAGNESIUM: MAGNESIUM: 2.1 mg/dL (ref 1.7–2.4)

## 2015-09-05 LAB — TSH: TSH: 0.411 u[IU]/mL (ref 0.350–4.500)

## 2015-09-05 MED ORDER — ACETAMINOPHEN 325 MG PO TABS: 650.0000 mg | ORAL_TABLET | Freq: Four times a day (QID) | ORAL | Status: DC | PRN

## 2015-09-05 MED ORDER — HYDROCORTISONE 0.5 % EX CREA
TOPICAL_CREAM | Freq: Three times a day (TID) | CUTANEOUS | Status: DC
  Filled 2015-09-05: qty 28.35

## 2015-09-05 MED ORDER — ACETAMINOPHEN 650 MG RE SUPP: 650.0000 mg | Freq: Four times a day (QID) | RECTAL | Status: DC | PRN

## 2015-09-05 MED ORDER — NICOTINE 21 MG/24HR TD PT24
21.0000 mg | MEDICATED_PATCH | Freq: Every day | TRANSDERMAL | Status: DC
  Administered 2015-09-05 – 2015-09-08 (×4): 21 mg via TRANSDERMAL
  Filled 2015-09-05 (×4): qty 1

## 2015-09-05 MED ORDER — HYDROMORPHONE HCL 1 MG/ML IJ SOLN
1.0000 mg | INTRAMUSCULAR | Status: DC | PRN
  Administered 2015-09-05: 1 mg via INTRAVENOUS
  Filled 2015-09-05: qty 1

## 2015-09-05 MED ORDER — SODIUM BICARBONATE 8.4 % IV SOLN
INTRAVENOUS | Status: DC
  Administered 2015-09-05 – 2015-09-06 (×2): via INTRAVENOUS
  Filled 2015-09-05 (×5): qty 1000

## 2015-09-05 MED ORDER — KCL IN DEXTROSE-NACL 20-5-0.9 MEQ/L-%-% IV SOLN
INTRAVENOUS | Status: DC
  Administered 2015-09-05: 14:00:00 via INTRAVENOUS

## 2015-09-05 MED ORDER — ONDANSETRON HCL 4 MG/2ML IJ SOLN
4.0000 mg | Freq: Three times a day (TID) | INTRAMUSCULAR | Status: DC
  Administered 2015-09-05 – 2015-09-07 (×8): 4 mg via INTRAVENOUS
  Filled 2015-09-05 (×10): qty 2

## 2015-09-05 MED ORDER — HYDROCORTISONE 1 % EX CREA
TOPICAL_CREAM | Freq: Three times a day (TID) | CUTANEOUS | Status: DC | PRN
  Administered 2015-09-05: 1 via TOPICAL
  Filled 2015-09-05 (×2): qty 1.5

## 2015-09-05 MED ORDER — ONDANSETRON HCL 4 MG/2ML IJ SOLN
4.0000 mg | Freq: Four times a day (QID) | INTRAMUSCULAR | Status: DC
  Administered 2015-09-05: 4 mg via INTRAVENOUS
  Filled 2015-09-05: qty 2

## 2015-09-05 MED ORDER — HYDROMORPHONE HCL 1 MG/ML IJ SOLN
0.5000 mg | INTRAMUSCULAR | Status: DC | PRN
  Administered 2015-09-05 – 2015-09-08 (×13): 1 mg via INTRAVENOUS
  Filled 2015-09-05 (×14): qty 1

## 2015-09-05 MED ORDER — PANTOPRAZOLE SODIUM 40 MG IV SOLR
40.0000 mg | Freq: Two times a day (BID) | INTRAVENOUS | Status: DC
  Administered 2015-09-05 – 2015-09-06 (×3): 40 mg via INTRAVENOUS
  Filled 2015-09-05 (×3): qty 40

## 2015-09-05 MED ORDER — PROMETHAZINE HCL 12.5 MG PO TABS
12.5000 mg | ORAL_TABLET | Freq: Four times a day (QID) | ORAL | Status: DC | PRN
  Administered 2015-09-07 – 2015-09-08 (×2): 12.5 mg via ORAL
  Filled 2015-09-05 (×2): qty 1

## 2015-09-05 MED ORDER — BOOST / RESOURCE BREEZE PO LIQD
1.0000 | Freq: Three times a day (TID) | ORAL | Status: DC
  Administered 2015-09-05: 1 via ORAL

## 2015-09-05 MED ORDER — ALBUTEROL SULFATE (2.5 MG/3ML) 0.083% IN NEBU: 2.5000 mg | INHALATION_SOLUTION | RESPIRATORY_TRACT | Status: DC | PRN

## 2015-09-05 MED ORDER — CLOTRIMAZOLE 2 % VA CREA
1.0000 | TOPICAL_CREAM | Freq: Every day | VAGINAL | Status: AC
  Administered 2015-09-06 – 2015-09-07 (×3): 1 via VAGINAL
  Filled 2015-09-05: qty 21

## 2015-09-05 NOTE — Progress Notes (Signed)
cc'ed to pcp °
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
## 2015-09-05 NOTE — Assessment & Plan Note (Signed)
19 year old female who presents with 10 day history of epigastric pain associated with vomiting. Initially had some diarrhea as well. Diarrhea has resolved. CT with possible duodenitis, appendix appeared normal, small amount of fluid in the right lower quadrant of unclear significance. H. pylori serologies were positive in urgent care. H. pylori does not tend to cause acute onset symptoms like this. She denies use of NSAIDs and aspirin powders. CT was suggestive of duodenitis. Symptoms do not sound biliary in etiology. There is no biochemical or CT evidence of pancreatitis. Patient denies IV drug use.  Based on patient's current symptoms, refractory vomiting and significant pain she would benefit from inpatient admission for supportive care and further diagnostic workup. Likely will require upper endoscopy as initial step. I have discussed case with Dr. Rexene Alberts who graciously has accepted patient as a direct admission. We will continue to follow her as an inpatient.

## 2015-09-05 NOTE — Patient Instructions (Signed)
1. Go straight to [facility] for direct admission. We will continue care as inpatient.
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
## 2015-09-06 ENCOUNTER — Encounter (HOSPITAL_COMMUNITY): Payer: Self-pay | Admitting: *Deleted

## 2015-09-06 ENCOUNTER — Encounter (HOSPITAL_COMMUNITY)
Admission: AD | Disposition: A | Discharge status: Home / Self Care | Payer: Self-pay | Source: Ambulatory Visit | Attending: Internal Medicine

## 2015-09-06 DIAGNOSIS — K2970 Gastritis, unspecified, without bleeding: Secondary | ICD-10-CM | POA: Diagnosis present

## 2015-09-06 DIAGNOSIS — A048 Other specified bacterial intestinal infections: Secondary | ICD-10-CM | POA: Diagnosis present

## 2015-09-06 HISTORY — PX: ESOPHAGOGASTRODUODENOSCOPY: SHX5428

## 2015-09-06 LAB — CBC
HEMATOCRIT: 37 % (ref 36.0–46.0)
Hemoglobin: 12.4 g/dL (ref 12.0–15.0)
MCH: 30.7 pg (ref 26.0–34.0)
MCHC: 33.5 g/dL (ref 30.0–36.0)
MCV: 91.6 fL (ref 78.0–100.0)
PLATELETS: 223 10*3/uL (ref 150–400)
RBC: 4.04 MIL/uL (ref 3.87–5.11)
RDW: 13.5 % (ref 11.5–15.5)
WBC: 6.9 10*3/uL (ref 4.0–10.5)

## 2015-09-06 LAB — BASIC METABOLIC PANEL
Anion gap: 3 — ABNORMAL LOW (ref 5–15)
BUN: 9 mg/dL (ref 6–20)
CHLORIDE: 109 mmol/L (ref 101–111)
CO2: 24 mmol/L (ref 22–32)
CREATININE: 0.84 mg/dL (ref 0.44–1.00)
Calcium: 8.8 mg/dL — ABNORMAL LOW (ref 8.9–10.3)
GFR calc non Af Amer: >60 mL/min (ref >60)
Glucose, Bld: 114 mg/dL — ABNORMAL HIGH (ref 65–99)
POTASSIUM: 4.1 mmol/L (ref 3.5–5.1)
SODIUM: 136 mmol/L (ref 135–145)

## 2015-09-06 SURGERY — EGD (ESOPHAGOGASTRODUODENOSCOPY)
Anesthesia: Moderate Sedation

## 2015-09-06 MED ORDER — PROMETHAZINE HCL 25 MG/ML IJ SOLN
INTRAMUSCULAR | Status: AC
  Filled 2015-09-06: qty 1

## 2015-09-06 MED ORDER — MEPERIDINE HCL 100 MG/ML IJ SOLN
INTRAMUSCULAR | Status: DC | PRN
  Administered 2015-09-06: 25 mg via INTRAVENOUS
  Administered 2015-09-06 (×2): 50 mg via INTRAVENOUS

## 2015-09-06 MED ORDER — STERILE WATER FOR IRRIGATION IR SOLN
Status: DC | PRN
  Administered 2015-09-06: 100 mL

## 2015-09-06 MED ORDER — MEPERIDINE HCL 100 MG/ML IJ SOLN
INTRAMUSCULAR | Status: AC
  Filled 2015-09-06: qty 2

## 2015-09-06 MED ORDER — PANTOPRAZOLE SODIUM 40 MG PO TBEC
40.0000 mg | DELAYED_RELEASE_TABLET | Freq: Two times a day (BID) | ORAL | Status: DC
  Administered 2015-09-06 – 2015-09-07 (×2): 40 mg via ORAL
  Filled 2015-09-06 (×2): qty 1

## 2015-09-06 MED ORDER — MIDAZOLAM HCL 5 MG/5ML IJ SOLN
INTRAMUSCULAR | Status: DC | PRN
  Administered 2015-09-06 (×3): 2 mg via INTRAVENOUS

## 2015-09-06 MED ORDER — LIDOCAINE VISCOUS 2 % MT SOLN
OROMUCOSAL | Status: AC
  Filled 2015-09-06: qty 15

## 2015-09-06 MED ORDER — LIDOCAINE VISCOUS 2 % MT SOLN
OROMUCOSAL | Status: DC | PRN
Start: 2015-09-06 — End: 2015-09-06
  Administered 2015-09-06: 1 via OROMUCOSAL

## 2015-09-06 MED ORDER — PROMETHAZINE HCL 25 MG/ML IJ SOLN
INTRAMUSCULAR | Status: DC | PRN
  Administered 2015-09-06: 12.5 mg via INTRAVENOUS

## 2015-09-06 MED ORDER — MIDAZOLAM HCL 5 MG/5ML IJ SOLN
INTRAMUSCULAR | Status: AC
  Filled 2015-09-06: qty 10

## 2015-09-06 MED ORDER — PROMETHAZINE HCL 25 MG/ML IJ SOLN
12.5000 mg | Freq: Once | INTRAMUSCULAR | Status: AC
  Administered 2015-09-06: 12.5 mg via INTRAVENOUS

## 2015-09-06 MED ORDER — SODIUM CHLORIDE 0.9 % IV SOLN
INTRAVENOUS | Status: DC
  Administered 2015-09-06: 13:00:00 via INTRAVENOUS

## 2015-09-06 MED ORDER — HYDROCORTISONE 1 % EX CREA
TOPICAL_CREAM | Freq: Three times a day (TID) | CUTANEOUS | Status: DC
  Administered 2015-09-06 (×2): 1 via TOPICAL
  Administered 2015-09-07: 22:00:00 via TOPICAL
  Administered 2015-09-07 (×2): 1 via TOPICAL
  Filled 2015-09-06: qty 21
  Filled 2015-09-06 (×2): qty 1.5

## 2015-09-06 MED ORDER — SODIUM CHLORIDE 0.9% FLUSH
INTRAVENOUS | Status: AC
  Filled 2015-09-06: qty 10
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
## 2015-09-06 NOTE — Progress Notes (Signed)
PROGRESS NOTE    Kaitlyn Garza  [id number] DOB: 07-10-1995 DOA: 09/05/2015 PCP: Cristal Ford, MD    Brief Narrative:  Patient is a 20 year old woman with a history of fibrocystic breast changes and tobacco abuse, who was admitted as a direct admission from [facility] for intractable nausea, vomiting, and epigastric abdominal pain. Outpatient CT of the abdomen and pelvis was suggestive of duodenitis. Outpatient H. pylori test was positive.   Assessment & Plan:   Principal Problem:   Abdominal pain, epigastric Active Problems:   Nausea with vomiting   Gastritis   Tobacco abuse   Positive H. pylori test   1. Epigastric abdominal pain. Patient was started on IV Protonix and schedule Zofran for presumed H. pylori duodenitis. IV fluids were started for hydration. Clear liquids were started, but H. pylori treatment was held due to her GI symptoms. Her LFTs and lipase were within normal limits on admission. -GI was consulted. Dr. Oneida Alar performed an EGD on 8/22 and it revealed mild gastritis-status post biopsied and a normal duodenum. -Dr. Oneida Alar ordered a tissue transglutaminase Iga and advanced the patient's diet to gluten-free. PPI changed  to by mouth. -Disposition depends on the patient's symptoms and GI recommendations.  Tobacco abuse. The patient was advised stop smoking. She eventually agreed to a nicotine patch.    DVT prophylaxis: SCDs Code Status: Full code Family Communication: Discussed with mother Disposition Plan: Discharge when clinically appropriate, possibly in the next day or 2.   Consultants:   Gastroenterology  Procedures:   EGD 09/06/15-mild gastritis; normal duodenum.  Antimicrobials:   None   Subjective: Patient says that she feels better. She denies nausea vomiting. (She was seen earlier this morning).  Objective: Vitals:   09/06/15 1510 09/06/15 1515 09/06/15 1520 09/06/15 1556  BP: 100/64 (!) 99/58 (!) 97/59  (!) 102/58  Pulse: 71 69 67 (!) 55  Resp: 20 20 19 20   Temp:    97.6 F (36.4 C)  TempSrc:    Axillary  SpO2: 100% 100% 100% 100%  Weight:      Height:        Intake/Output Summary (Last 24 hours) at 09/06/15 1906 Last data filed at 09/06/15 1512  Gross per 24 hour  Intake          2086.75 ml  Output              400 ml  Net          1686.75 ml   Filed Weights   09/06/15 0500 09/06/15 1227  Weight: 56.2 kg (123 lb 14.4 oz) 55.8 kg (123 lb)    Examination:  General exam: Appears calm and comfortable  Respiratory system: Clear to auscultation. Respiratory effort normal. Cardiovascular system: S1 & S2 heard, RRR. No JVD, murmurs, rubs, gallops or clicks. No pedal edema. Gastrointestinal system: Abdomen is nondistended, soft and very mild epigastric tenderness No organomegaly or masses felt. Normal bowel sounds heard. Central nervous system: Alert and oriented. No focal neurological deficits. Extremities: Symmetric 5 x 5 power. Skin: No rashes, lesions or ulcers Psychiatry: Judgement and insight appear normal. Mood & affect appropriate.     Data Reviewed: I have personally reviewed following labs and imaging studies  CBC:  Recent Labs Lab 08/30/15 2112 09/05/15 1423 09/06/15 0704  WBC 8.8 8.7 6.9  NEUTROABS  --  7.5  --   HGB 13.7 14.0 12.4  HCT 41.0 40.9 37.0  MCV 92.3 90.5 91.6  PLT 276 265 223   Basic  Metabolic Panel:  Recent Labs Lab 08/30/15 2112 09/05/15 1423 09/06/15 0704  NA 138 134* 136  K 3.7 3.4* 4.1  CL 104 110 109  CO2 23 18* 24  GLUCOSE 91 113* 114*  BUN 9 11 9   CREATININE 0.88 0.72 0.84  CALCIUM 9.7 8.9 8.8*  MG  --  2.1  --    GFR: Estimated Creatinine Clearance: 83.1 mL/min (by C-G formula based on SCr of 0.84 mg/dL). Liver Function Tests:  Recent Labs Lab 08/30/15 2112 09/05/15 1423  AST 22 18  ALT 16 14  ALKPHOS 74 73  BILITOT 0.8 0.6  PROT 7.6 7.9  ALBUMIN 4.5 4.6    Recent Labs Lab 08/30/15 2112 09/05/15 1423    LIPASE 21 26   No results for input(s): AMMONIA in the last 168 hours. Coagulation Profile: No results for input(s): INR, PROTIME in the last 168 hours. Cardiac Enzymes: No results for input(s): CKTOTAL, CKMB, CKMBINDEX, TROPONINI in the last 168 hours. BNP (last 3 results) No results for input(s): PROBNP in the last 8760 hours. HbA1C: No results for input(s): HGBA1C in the last 72 hours. CBG: No results for input(s): GLUCAP in the last 168 hours. Lipid Profile: No results for input(s): CHOL, HDL, LDLCALC, TRIG, CHOLHDL, LDLDIRECT in the last 72 hours. Thyroid Function Tests:  Recent Labs  09/05/15 1423  TSH 0.411   Anemia Panel: No results for input(s): VITAMINB12, FOLATE, FERRITIN, TIBC, IRON, RETICCTPCT in the last 72 hours. Sepsis Labs: No results for input(s): PROCALCITON, LATICACIDVEN in the last 168 hours.  No results found for this or any previous visit (from the past 240 hour(s)).       Radiology Studies: No results found.      Scheduled Meds: . clotrimazole  1 Applicatorful Vaginal QHS  . hydrocortisone cream   Topical TID  . lidocaine      . meperidine      . midazolam      . nicotine  21 mg Transdermal Daily  . ondansetron (ZOFRAN) IV  4 mg Intravenous TID WC & HS  . pantoprazole  40 mg Oral BID AC  . promethazine      . sodium chloride flush       Continuous Infusions: . dextrose 5 % and 0.45% NaCl 1,000 mL with potassium chloride 40 mEq, sodium bicarbonate 50 mEq infusion Stopped (09/06/15 1240)     LOS: 1 day    Time spent: 41 minutes    Rexene Alberts, MD [facility] Pager [phone]  If 7PM-7AM, please contact night-coverage [email or web address] Password [email or web address] 09/06/2015, 7:06 PM

## 2015-09-06 NOTE — Progress Notes (Signed)
Initial Nutrition Assessment    INTERVENTION:  Gluten free diet  Snacks to be offered between meals   Provide diet education as indicated  NUTRITION DIAGNOSIS:   Inadequate oral intake related to nausea, vomiting, acute illness as evidenced by per patient/family report.   GOAL:   Patient will meet greater than or equal to 90% of their needs   MONITOR:   Diet advancement, PO intake, Labs, Weight trends  REASON FOR ASSESSMENT:  Malnutrition Screen       ASSESSMENT: Patient is a 20 yo female who presents with c/o nausea, vomiting and abdominal pain. Her abdominal/pelvic CT concerning for duodenitis and H. Pylori findings were positive. Patient was NPO at time of visit and had upper GI endoscopy scheduled for this afternoon.  Biopsies were obtained to check transglutaminase. Diet was advanced to Gluten-free.   No BM since 8/17??  Nutrition-Focused physical exam completed. Findings are no fat or muscle depletion, and no edema.     Recent Labs Lab 08/30/15 2112 09/05/15 1423 09/06/15 0704  NA 138 134* 136  K 3.7 3.4* 4.1  CL 104 110 109  CO2 23 18* 24  BUN 9 11 9   CREATININE 0.88 0.72 0.84  CALCIUM 9.7 8.9 8.8*  MG  --  2.1  --   GLUCOSE 91 113* 114*   Labs: reviewed  Diet Order:  Diet gluten free Room service appropriate? Yes; Fluid consistency: Thin  Skin:  Reviewed, no issues  Last BM:  8/17 c/o bloating and cramping per nursing  Height:   Ht Readings from Last 1 Encounters:  09/06/15 5\' 2"  (1.575 m)    Weight:   Wt Readings from Last 1 Encounters:  09/06/15 123 lb (55.8 kg)    Ideal Body Weight:  50 kg  BMI:  Body mass index is 22.5 kg/m.  Estimated Nutritional Needs:   Kcal:  [id number]  Protein:  73-84 gr  Fluid:  2.0 liters daily  EDUCATION NEEDS:   No education needs identified at this time  Colman Cater MS,RD,CSG,LDN Office: [phone] Pager: [phone]

## 2015-09-06 NOTE — Op Note (Signed)
[facility] Patient Name: Kaitlyn Garza Procedure Date: 09/06/2015 2:31 PM MRN: [id number] Date of Birth: 09-29-1995 Attending MD: Barney Drain , MD CSN: [id number] Age: 20 Admit Type: Inpatient Procedure:                Upper GI endoscopy WITH COLD FORCEPS BIOPSY Indications:              Epigastric abdominal pain, Abnormal CT of the GI                            tract, Nausea with vomiting Providers:                Barney Drain, MD, Rosina Lowenstein, RN, Purcell Nails.                            Tina Griffiths, Technician Referring MD:             Leisa Lenz MD, MD Medicines:                Fentanyl 125 micrograms IV, Midazolam 6 mg IV,                            Promethazine 25 mg IV Complications:            No immediate complications. Estimated Blood Loss:     Estimated blood loss was minimal. Procedure:                Pre-Anesthesia Assessment:                           - Prior to the procedure, a History and Physical                            was performed, and patient medications and                            allergies were reviewed. The patient's tolerance of                            previous anesthesia was also reviewed. The risks                            and benefits of the procedure and the sedation                            options and risks were discussed with the patient.                            All questions were answered, and informed consent                            was obtained. Prior Anticoagulants: The patient has                            taken no previous anticoagulant or antiplatelet  agents. ASA Grade Assessment: I - A normal, healthy                            patient. After reviewing the risks and benefits,                            the patient was deemed in satisfactory condition to                            undergo the procedure. After obtaining informed                            consent, the endoscope was passed under  direct                            vision. Throughout the procedure, the patient's                            blood pressure, pulse, and oxygen saturations were                            monitored continuously. The [id number]                            scope was introduced through the mouth, and                            advanced to the second part of duodenum. The upper                            GI endoscopy was accomplished without difficulty.                            The patient tolerated the procedure well. Scope In: 2:57:52 PM Scope Out: 3:07:12 PM Total Procedure Duration: 0 hours 9 minutes 20 seconds  Findings:      The examined esophagus was normal.      Patchy mild inflammation characterized by congestion (edema) and       erythema was found in the gastric antrum. Biopsies were taken with a       cold forceps for Helicobacter pylori testing.      The examined duodenum was normal. Biopsies for histology were taken with       a cold forceps for evaluation of celiac disease. Impression:               - MILD Gastritis. Biopsied.                           - Normal examined duodenum. Moderate Sedation:      Moderate (conscious) sedation was administered by the endoscopy nurse       and supervised by the endoscopist. The following parameters were       monitored: oxygen saturation, heart rate, blood pressure, and response       to care. Total physician intraservice time was 26 minutes. Recommendation:           -  Await pathology results. CHECK TISSUE                            TRANSGLUTAMINASE Iga TODAY.                           - Advance diet as tolerated-GLUTEN FREE.                           - Use Protonix (pantoprazole) 40 mg PO BID.                           - Return patient to hospital ward.                           - Return to my office in 4 weeks.                           - Continue present medications. Procedure Code(s):        --- Professional ---                            [phone], Esophagogastroduodenoscopy, flexible,                            transoral; with biopsy, single or multiple                           99152, Moderate sedation services provided by the                            same physician or other qualified health care                            professional performing the diagnostic or                            therapeutic service that the sedation supports,                            requiring the presence of an independent trained                            observer to assist in the monitoring of the                            patient's level of consciousness and physiological                            status; initial 15 minutes of intraservice time,                            patient age 59 years or older                           [phone],  Moderate sedation services; each additional                            15 minutes intraservice time Diagnosis Code(s):        --- Professional ---                           K29.70, Gastritis, unspecified, without bleeding                           R10.13, Epigastric pain                           R11.2, Nausea with vomiting, unspecified                           R93.3, Abnormal findings on diagnostic imaging of                            other parts of digestive tract CPT copyright 2016 [employer]. All rights reserved. The codes documented in this report are preliminary and upon coder review may  be revised to meet current compliance requirements. Barney Drain, MD Barney Drain, MD 09/06/2015 3:16:23 PM This report has been signed electronically. Number of Addenda: 0

## 2015-09-07 DIAGNOSIS — R1013 Epigastric pain: Secondary | ICD-10-CM

## 2015-09-07 DIAGNOSIS — K2970 Gastritis, unspecified, without bleeding: Principal | ICD-10-CM

## 2015-09-07 DIAGNOSIS — B9681 Helicobacter pylori [H. pylori] as the cause of diseases classified elsewhere: Secondary | ICD-10-CM

## 2015-09-07 LAB — URINE CULTURE: CULTURE: NO GROWTH

## 2015-09-07 LAB — TISSUE TRANSGLUTAMINASE, IGA

## 2015-09-07 MED ORDER — HYDROCODONE-ACETAMINOPHEN 5-325 MG PO TABS
1.0000 | ORAL_TABLET | ORAL | Status: DC | PRN
  Administered 2015-09-07 – 2015-09-08 (×2): 1 via ORAL
  Filled 2015-09-07 (×3): qty 1

## 2015-09-07 MED ORDER — PANTOPRAZOLE SODIUM 40 MG PO TBEC
40.0000 mg | DELAYED_RELEASE_TABLET | Freq: Two times a day (BID) | ORAL | Status: DC
  Administered 2015-09-07 – 2015-09-08 (×3): 40 mg via ORAL
  Filled 2015-09-07 (×3): qty 1

## 2015-09-07 MED ORDER — SUCRALFATE 1 GM/10ML PO SUSP
1.0000 g | Freq: Three times a day (TID) | ORAL | Status: DC
  Administered 2015-09-07 – 2015-09-08 (×3): 1 g via ORAL
  Filled 2015-09-07 (×3): qty 10

## 2015-09-07 MED ORDER — AMOXICILLIN 250 MG PO CAPS
1000.0000 mg | ORAL_CAPSULE | Freq: Two times a day (BID) | ORAL | Status: DC
  Administered 2015-09-07 – 2015-09-08 (×3): 1000 mg via ORAL
  Filled 2015-09-07 (×3): qty 4

## 2015-09-07 MED ORDER — CLARITHROMYCIN 500 MG PO TABS
500.0000 mg | ORAL_TABLET | Freq: Two times a day (BID) | ORAL | Status: DC
  Administered 2015-09-07 – 2015-09-08 (×4): 500 mg via ORAL
  Filled 2015-09-07 (×3): qty 1

## 2015-09-07 NOTE — Progress Notes (Signed)
PROGRESS NOTE    Kaitlyn Garza  [id number] DOB: 18-Aug-1995 DOA: 09/05/2015 PCP: Cristal Ford, MD    Brief Narrative:  Patient is a 20 year old woman with a history of fibrocystic breast changes and tobacco abuse, who was admitted as a direct admission from [facility] for intractable nausea, vomiting, and epigastric abdominal pain. Outpatient CT of the abdomen and pelvis was suggestive of duodenitis. Outpatient H. pylori test was positive.   Assessment & Plan:   Principal Problem:   Abdominal pain, epigastric Active Problems:   Nausea with vomiting   Tobacco abuse   Positive H. pylori test   Gastritis   1. Epigastric abdominal pain. Patient was started on IV Protonix and schedule Zofran for presumed H. pylori duodenitis. IV fluids were started for hydration. Clear liquids were started, but H. pylori treatment was held due to her GI symptoms. Her LFTs and lipase were within normal limits on admission. -GI was consulted. Dr. Oneida Alar performed an EGD on 8/22 and it revealed mild gastritis-status post biopsied and a normal duodenum. -Dr. Oneida Alar ordered a tissue transglutaminase Iga and advanced the patient's diet to gluten-free. PPI changed  to by mouth. -Vomiting and abdominal pain is improved, as per GI plan to restart treatment for H. pylori, hope for discharge home over the next 24 hours with symptomatic improvement.  Tobacco abuse. The patient was advised stop smoking. She eventually agreed to a nicotine patch.    DVT prophylaxis: SCDs Code Status: Full code Family Communication: Discussed with mother Disposition Plan: Discharge when clinically appropriate, possibly in the next day or 2.   Consultants:   Gastroenterology  Procedures:   EGD 09/06/15-mild gastritis; normal duodenum.  Antimicrobials:   None   Subjective: Patient says that she feels better. She denies nausea vomiting.  Objective: Vitals:   09/06/15 1556 09/06/15 2100  09/06/15 2300 09/07/15 0500  BP: (!) 102/58 111/61  105/67  Pulse: (!) 55 62  (!) 59  Resp: 20 18  18   Temp: 97.6 F (36.4 C) 98.5 F (36.9 C)  98.6 F (37 C)  TempSrc: Axillary Oral  Oral  SpO2: 100% 100% 98% 97%  Weight:    56.7 kg (125 lb)  Height:        Intake/Output Summary (Last 24 hours) at 09/07/15 1207 Last data filed at 09/07/15 1010  Gross per 24 hour  Intake          1451.75 ml  Output             1750 ml  Net          -298.25 ml   Filed Weights   09/06/15 0500 09/06/15 1227 09/07/15 0500  Weight: 56.2 kg (123 lb 14.4 oz) 55.8 kg (123 lb) 56.7 kg (125 lb)    Examination:  General exam: Appears calm and comfortable  Respiratory system: Clear to auscultation. Respiratory effort normal. Cardiovascular system: S1 & S2 heard, RRR. No JVD, murmurs, rubs, gallops or clicks. No pedal edema. Gastrointestinal system: Abdomen is nondistended, soft and very mild epigastric tenderness No organomegaly or masses felt. Normal bowel sounds heard. Central nervous system: Alert and oriented. No focal neurological deficits. Extremities: Symmetric 5 x 5 power. Skin: No rashes, lesions or ulcers Psychiatry: Judgement and insight appear normal. Mood & affect appropriate.     Data Reviewed: I have personally reviewed following labs and imaging studies  CBC:  Recent Labs Lab 09/05/15 1423 09/06/15 0704  WBC 8.7 6.9  NEUTROABS 7.5  --   HGB 14.0  12.4  HCT 40.9 37.0  MCV 90.5 91.6  PLT 265 [id number]   Basic Metabolic Panel:  Recent Labs Lab 09/05/15 1423 09/06/15 0704  NA 134* 136  K 3.4* 4.1  CL 110 109  CO2 18* 24  GLUCOSE 113* 114*  BUN 11 9  CREATININE 0.72 0.84  CALCIUM 8.9 8.8*  MG 2.1  --    GFR: Estimated Creatinine Clearance: 83.1 mL/min (by C-G formula based on SCr of 0.84 mg/dL). Liver Function Tests:  Recent Labs Lab 09/05/15 1423  AST 18  ALT 14  ALKPHOS 73  BILITOT 0.6  PROT 7.9  ALBUMIN 4.6    Recent Labs Lab 09/05/15 1423  LIPASE 26     No results for input(s): AMMONIA in the last 168 hours. Coagulation Profile: No results for input(s): INR, PROTIME in the last 168 hours. Cardiac Enzymes: No results for input(s): CKTOTAL, CKMB, CKMBINDEX, TROPONINI in the last 168 hours. BNP (last 3 results) No results for input(s): PROBNP in the last 8760 hours. HbA1C: No results for input(s): HGBA1C in the last 72 hours. CBG: No results for input(s): GLUCAP in the last 168 hours. Lipid Profile: No results for input(s): CHOL, HDL, LDLCALC, TRIG, CHOLHDL, LDLDIRECT in the last 72 hours. Thyroid Function Tests:  Recent Labs  09/05/15 1423  TSH 0.411   Anemia Panel: No results for input(s): VITAMINB12, FOLATE, FERRITIN, TIBC, IRON, RETICCTPCT in the last 72 hours. Sepsis Labs: No results for input(s): PROCALCITON, LATICACIDVEN in the last 168 hours.  No results found for this or any previous visit (from the past 240 hour(s)).       Radiology Studies: No results found.      Scheduled Meds: . amoxicillin  1,000 mg Oral BID  . clarithromycin  500 mg Oral BID  . clotrimazole  1 Applicatorful Vaginal QHS  . hydrocortisone cream   Topical TID  . nicotine  21 mg Transdermal Daily  . ondansetron (ZOFRAN) IV  4 mg Intravenous TID WC & HS  . pantoprazole  40 mg Oral BID AC  . sucralfate  1 g Oral TID WC & HS   Continuous Infusions: . dextrose 5 % and 0.45% NaCl 1,000 mL with potassium chloride 40 mEq, sodium bicarbonate 50 mEq infusion Stopped (09/06/15 1240)     LOS: 2 days    Time spent: 30 minutes    Lelon Frohlich, MD [facility] Pager [phone]  If 7PM-7AM, please contact night-coverage [email or web address] Password [facility] 09/07/2015, 12:07 PM

## 2015-09-07 NOTE — Progress Notes (Signed)
Subjective:  Pain being adequately managed. Vomiting subsided. Anxious about her pain and etiology. Answered multiple questions presented by patient and mother.   Objective: Vital signs in last 24 hours: Temp:  [97.6 F (36.4 C)-98.6 F (37 C)] 98.6 F (37 C) (08/23 0500) Pulse Rate:  [55-79] 59 (08/23 0500) Resp:  [15-21] 18 (08/23 0500) BP: (97-121)/(58-78) 105/67 (08/23 0500) SpO2:  [97 %-100 %] 97 % (08/23 0500) Weight:  [123 lb (55.8 kg)-125 lb (56.7 kg)] 125 lb (56.7 kg) (08/23 0500) Last BM Date: 09/01/15 General:   Alert,  Well-developed, well-nourished, pleasant and cooperative in NAD Head:  Normocephalic and atraumatic. Eyes:  Sclera clear, no icterus.  Abdomen:  Soft, moderate epigastric tenderness and nondistended.  Normal bowel sounds, without guarding, and without rebound.   Extremities:  Without clubbing, deformity or edema. Neurologic:  Alert and  oriented x4;  grossly normal neurologically. Skin:  Intact without significant lesions or rashes. Psych:  Alert and cooperative. Normal mood and affect.  Intake/Output from previous day: 08/22 0701 - 08/23 0700 In: 1331.8 [P.O.:120; I.V.:1211.8] Out: 1150 [Urine:1150] Intake/Output this shift: No intake/output data recorded.  Lab Results: CBC  Recent Labs  09/05/15 1423 09/06/15 0704  WBC 8.7 6.9  HGB 14.0 12.4  HCT 40.9 37.0  MCV 90.5 91.6  PLT 265 223   BMET  Recent Labs  09/05/15 1423 09/06/15 0704  NA 134* 136  K 3.4* 4.1  CL 110 109  CO2 18* 24  GLUCOSE 113* 114*  BUN 11 9  CREATININE 0.72 0.84  CALCIUM 8.9 8.8*   LFTs  Recent Labs  09/05/15 1423  BILITOT 0.6  ALKPHOS 73  AST 18  ALT 14  PROT 7.9  ALBUMIN 4.6    Recent Labs  09/05/15 1423  LIPASE 26   PT/INR No results for input(s): LABPROT, INR in the last 72 hours.    Imaging Studies: Ct Abdomen Pelvis W Contrast  Result Date: 08/31/2015 CLINICAL DATA:  Acute onset of generalized abdominal pain and vomiting. Initial  encounter. EXAM: CT ABDOMEN AND PELVIS WITH CONTRAST TECHNIQUE: Multidetector CT imaging of the abdomen and pelvis was performed using the standard protocol following bolus administration of intravenous contrast. CONTRAST:  100 mL of Isovue 300 IV contrast COMPARISON:  CT of the chest, abdomen and pelvis performed 10/22/2011 FINDINGS: The visualized lung bases are clear. The liver and spleen are unremarkable in appearance. The gallbladder is within normal limits. The pancreas and adrenal glands are unremarkable. The kidneys are unremarkable in appearance. There is no evidence of hydronephrosis. No renal or ureteral stones are seen. No perinephric stranding is appreciated. The duodenum appears somewhat thick walled, though this is nonspecific. Would correlate for any evidence of duodenitis. The remaining small bowel is grossly unremarkable. The stomach is within normal limits. No acute vascular abnormalities are seen. The appendix appears to be normal in caliber, containing a small amount of contrast, without definite evidence for appendicitis. However, a small amount of free fluid is noted at the right lower quadrant, of uncertain significance. The colon is largely decompressed and unremarkable in appearance. The bladder is mildly distended and grossly unremarkable. The uterus is grossly unremarkable in appearance. The ovaries are relatively symmetric. No suspicious adnexal masses are seen. No inguinal lymphadenopathy is seen. A metallic piercing is noted at the labia. No acute osseous abnormalities are identified. IMPRESSION: 1. Small amount of fluid at the right lower quadrant, of uncertain significance. The appendix appears to be normal in caliber, without definite evidence  for appendicitis. 2. Duodenum appears somewhat thick walled, though this is nonspecific. Would correlate for any evidence of duodenitis. Electronically Signed   By: Garald Balding M.D.   On: 08/31/2015 01:20  [2 weeks]   Assessment: 20  y/o female with 10 day h/o epigastric pain associated with vomiting. CT with ?duodenitis. H.Pylori serologies positive in urgent care and completed three days of amoxicillin/biaxin/omeprazole prior to admission. EGD yesterday with mild gastritis s/p biopsy. Gastric and duodenal biopsies obtained. Today pain is somewhat different. More of burning quality. Nausea improved but still not eating very much. Has shown some improvement.   Plan: 1. Transition to oral pain medication and oral antiemetics to move towards discharge.  2. Follow up pending celiac serologies and biopsies.  3. Resume H.pylori treatment. Patient instructed to complete her H.pylori meds she has at home once discharged. This would give her adequate treatment. We will check for h.pylori eradication in couple of months via breath test or stool antigen.  4. Add carafate for supportive measures.   Laureen Ochs. Bernarda Caffey [facility] [phone] 8/23/20179:40 AM     LOS: 2 days

## 2015-09-08 ENCOUNTER — Telehealth: Payer: Self-pay | Admitting: Gastroenterology

## 2015-09-08 MED ORDER — CLARITHROMYCIN 500 MG PO TABS
500.0000 mg | ORAL_TABLET | Freq: Two times a day (BID) | ORAL | 0 refills | Status: DC
Start: 2015-09-08 — End: 2017-01-10

## 2015-09-08 MED ORDER — HYDROCODONE-ACETAMINOPHEN 5-325 MG PO TABS
1.0000 | ORAL_TABLET | ORAL | Status: DC | PRN
  Administered 2015-09-08: 2 via ORAL
  Filled 2015-09-08: qty 2

## 2015-09-08 MED ORDER — SUCRALFATE 1 GM/10ML PO SUSP: 1.0000 g | Freq: Three times a day (TID) | ORAL | 0 refills | Status: DC

## 2015-09-08 MED ORDER — HYDROCODONE-ACETAMINOPHEN 5-325 MG PO TABS: 1.0000 | ORAL_TABLET | ORAL | 0 refills | Status: DC | PRN

## 2015-09-08 MED ORDER — AMOXICILLIN 500 MG PO CAPS: 1000.0000 mg | ORAL_CAPSULE | Freq: Two times a day (BID) | ORAL | 0 refills | Status: DC

## 2015-09-08 MED ORDER — PROMETHAZINE HCL 12.5 MG PO TABS: 12.5000 mg | ORAL_TABLET | Freq: Four times a day (QID) | ORAL | 0 refills | Status: DC | PRN

## 2015-09-08 MED ORDER — ONDANSETRON HCL 4 MG PO TABS
4.0000 mg | ORAL_TABLET | Freq: Four times a day (QID) | ORAL | Status: DC | PRN
  Administered 2015-09-08: 4 mg via ORAL
  Filled 2015-09-08: qty 1

## 2015-09-08 MED ORDER — PANTOPRAZOLE SODIUM 40 MG PO TBEC: 40.0000 mg | DELAYED_RELEASE_TABLET | Freq: Two times a day (BID) | ORAL | 1 refills | Status: DC

## 2015-09-08 NOTE — Discharge Summary (Signed)
Physician Discharge Summary  Kaitlyn Garza [id number] DOB: 09-16-1995 DOA: 09/05/2015  PCP: Cristal Ford, MD  Admit date: 09/05/2015 Discharge date: 09/08/2015  Time spent: 45 minutes  Recommendations for Outpatient Follow-up:  -Will be discharged home today. -Will follow up with PCP in 2 weeks.   Discharge Diagnoses:  Principal Problem:   Abdominal pain, epigastric Active Problems:   Nausea with vomiting   Tobacco abuse   Positive H. pylori test   Gastritis   Discharge Condition: Stable and improved  Filed Weights   09/06/15 1227 09/07/15 0500 09/08/15 0536  Weight: 55.8 kg (123 lb) 56.7 kg (125 lb) 58.5 kg (129 lb)    History of present illness:  As per Dr. Caryn Section on 8/21: Kaitlyn Garza is a 20 y.o. female with medical history significant for fibrocystic breast changes and tobacco abuse, who presents as a direct admission from [facility]. The patient was seen by GI PA, Ms. Bobby Rumpf today after she was referred to GI by her PCP, Dr. Jomarie Longs for evaluation of persistent epigastric abdominal pain/nausea/vomiting. Her symptoms started a 1 and 1/2 weeks ago when she was in [place]. She ate out at multiple restaurants with friends during that time. She began to have nausea, vomiting, abdominal pain, and diarrhea. When she returned home to [facility], she presented to the urgent care on 08/30/15. There was a concern about appendicitis, so she was referred to the ED at [facility]. CT scan of her abdomen/pelvis on 08/31/15 revealed a normal appearing appendix, small amount of free fluid in the RLQ, and a duodenum that appeared thick walled. She was subsequently seen by Dr. Jomarie Longs on 09/02/15 who ordered H. pylori serologies. H. pylori was positive, so she was started on Flagyl, amoxicillin, and omeprazole. Her symptoms subsided, but they returned this morning. She says the pain in her epigastrium feels like "a hot knife". It comes and goes, but at its worse is 10 over 10 in  intensity. She has had several episodes of vomiting today, none with coffee grounds emesis or bright red blood. She had a small dark colored bowel movement a few days ago after taking Pepto-Bismol. She denies bright red blood per rectum.  She is being admitted for further evaluation and management of possible H pylori and what nidus.     Hospital Course:    Epigastric abdominal pain. Patient was started on IV Protonix and schedule Zofran for presumed H. pylori duodenitis. IV fluids were started for hydration. Clear liquids were started, but H. pylori treatment was held due to her GI symptoms. Her LFTs and lipase were within normal limits on admission. -GI was consulted. Dr. Oneida Alar performed an EGD on 8/22 and it revealed mild gastritis-status post biopsied and a normal duodenum. -Dr. Oneida Alar ordered a tissue transglutaminase Iga and advanced the patient's diet to gluten-free. PPI changed  to by mouth. -Vomiting and abdominal pain is improved, as per GI plan to restart treatment for H. pylori,  Tobacco abuse. The patient was advised stop smoking. She eventually agreed to a nicotine patch.  Procedures: EGD:  Impression:               - MILD Gastritis. Biopsied.                            - Normal examined duodenum.  Consultations:  None  Discharge Instructions  Discharge Instructions    Increase activity slowly    Complete by:  As directed  Medication List    STOP taking these medications   bismuth subsalicylate [id number] suspension Commonly known as:  PEPTO BISMOL     TAKE these medications   amoxicillin 500 MG capsule Commonly known as:  AMOXIL Take 2 capsules (1,000 mg total) by mouth 2 (two) times daily. What changed:  how much to take  additional instructions   clarithromycin 500 MG tablet Commonly known as:  BIAXIN Take 1 tablet (500 mg total) by mouth 2 (two) times daily. What changed:  additional instructions   HYDROcodone-acetaminophen 5-325 MG  tablet Commonly known as:  NORCO/VICODIN Take 1-2 tablets by mouth every 4 (four) hours as needed for moderate pain.   pantoprazole 40 MG tablet Commonly known as:  PROTONIX Take 1 tablet (40 mg total) by mouth 2 (two) times daily before a meal.   promethazine 12.5 MG tablet Commonly known as:  PHENERGAN Take 1 tablet (12.5 mg total) by mouth every 6 (six) hours as needed for nausea or vomiting.   sucralfate 1 GM/10ML suspension Commonly known as:  CARAFATE Take 10 mLs (1 g total) by mouth 4 (four) times daily -  with meals and at bedtime.      No Known Allergies Follow-up Information    Cristal Ford, MD. Schedule an appointment as soon as possible for a visit in 2 week(s).   Specialty:  Obstetrics and Gynecology Contact information: [place]  [id number] [phone]            The results of significant diagnostics from this hospitalization (including imaging, microbiology, ancillary and laboratory) are listed below for reference.    Significant Diagnostic Studies: Ct Abdomen Pelvis W Contrast  Result Date: 08/31/2015 CLINICAL DATA:  Acute onset of generalized abdominal pain and vomiting. Initial encounter. EXAM: CT ABDOMEN AND PELVIS WITH CONTRAST TECHNIQUE: Multidetector CT imaging of the abdomen and pelvis was performed using the standard protocol following bolus administration of intravenous contrast. CONTRAST:  100 mL of Isovue 300 IV contrast COMPARISON:  CT of the chest, abdomen and pelvis performed 10/22/2011 FINDINGS: The visualized lung bases are clear. The liver and spleen are unremarkable in appearance. The gallbladder is within normal limits. The pancreas and adrenal glands are unremarkable. The kidneys are unremarkable in appearance. There is no evidence of hydronephrosis. No renal or ureteral stones are seen. No perinephric stranding is appreciated. The duodenum appears somewhat thick walled, though this is nonspecific. Would  correlate for any evidence of duodenitis. The remaining small bowel is grossly unremarkable. The stomach is within normal limits. No acute vascular abnormalities are seen. The appendix appears to be normal in caliber, containing a small amount of contrast, without definite evidence for appendicitis. However, a small amount of free fluid is noted at the right lower quadrant, of uncertain significance. The colon is largely decompressed and unremarkable in appearance. The bladder is mildly distended and grossly unremarkable. The uterus is grossly unremarkable in appearance. The ovaries are relatively symmetric. No suspicious adnexal masses are seen. No inguinal lymphadenopathy is seen. A metallic piercing is noted at the labia. No acute osseous abnormalities are identified. IMPRESSION: 1. Small amount of fluid at the right lower quadrant, of uncertain significance. The appendix appears to be normal in caliber, without definite evidence for appendicitis. 2. Duodenum appears somewhat thick walled, though this is nonspecific. Would correlate for any evidence of duodenitis. Electronically Signed   By: Garald Balding M.D.   On: 08/31/2015 01:20    Microbiology: Recent Results (from the past 240  hour(s))  Urine culture     Status: None   Collection Time: 09/05/15  6:40 PM  Result Value Ref Range Status   Specimen Description URINE, CLEAN CATCH  Final   Special Requests NONE  Final   Culture NO GROWTH Performed at [facility]   Final   Report Status 09/07/2015 FINAL  Final     Labs: Basic Metabolic Panel:  Recent Labs Lab 09/05/15 1423 09/06/15 0704  NA 134* 136  K 3.4* 4.1  CL 110 109  CO2 18* 24  GLUCOSE 113* 114*  BUN 11 9  CREATININE 0.72 0.84  CALCIUM 8.9 8.8*  MG 2.1  --    Liver Function Tests:  Recent Labs Lab 09/05/15 1423  AST 18  ALT 14  ALKPHOS 73  BILITOT 0.6  PROT 7.9  ALBUMIN 4.6    Recent Labs Lab 09/05/15 1423  LIPASE 26   No results for input(s):  AMMONIA in the last 168 hours. CBC:  Recent Labs Lab 09/05/15 1423 09/06/15 0704  WBC 8.7 6.9  NEUTROABS 7.5  --   HGB 14.0 12.4  HCT 40.9 37.0  MCV 90.5 91.6  PLT 265 223   Cardiac Enzymes: No results for input(s): CKTOTAL, CKMB, CKMBINDEX, TROPONINI in the last 168 hours. BNP: BNP (last 3 results) No results for input(s): BNP in the last 8760 hours.  ProBNP (last 3 results) No results for input(s): PROBNP in the last 8760 hours.  CBG: No results for input(s): GLUCAP in the last 168 hours.     SignedLelon Frohlich  [facility] Pager: [phone] 09/08/2015, 11:38 AM

## 2015-09-08 NOTE — Progress Notes (Signed)
Discharge instructions and prescriptions given, verbalized understanding, out in stable condition ambulatory with mother.

## 2015-09-08 NOTE — Telephone Encounter (Signed)
Please call pt. HER stomach Bx shows mild gastritis.  COMPLETE ABX FOR H PYLORI GASTRITIS. HER SMALL BOWEL BIOPSIES ARE NORMAL. SHE SHOULD ADVANCE HER DIET AS TOLERATED.

## 2015-09-08 NOTE — Progress Notes (Signed)
Patient has been stable all night. Has tolerated food, drink and medication without any vomiting, only mild nausea.  At approximately 0530, when lab and staff were in room to draw labs and obtain vitals, patient became highly agitated and upset that she was disturbed, as patient said she has just recently fallen asleep.  Last time nurse was in room approximately 0440 to give her iv pain medication. Prior to this event, patient had been awake all night, interacting with electronic devices, and had a pleasant demeanor. Patient does not want to be disturbed at this time, and wants to sleep.

## 2015-09-08 NOTE — Progress Notes (Signed)
Subjective:  Patient states her nausea has resolved. Her abdominal pain persists but improved from admission. Now at 5 out of 10 on pain scale. She didn't feel like vicodin worked well. She wants to go home.   Objective: Vital signs in last 24 hours: Temp:  [98.6 F (37 C)-98.8 F (37.1 C)] 98.6 F (37 C) (08/24 0536) Pulse Rate:  [80-100] 100 (08/24 0536) Resp:  [18-20] 18 (08/24 0536) BP: (117-122)/(69-77) 119/69 (08/24 0536) SpO2:  [98 %-100 %] 100 % (08/24 0536) Weight:  [129 lb (58.5 kg)] 129 lb (58.5 kg) (08/24 0536) Last BM Date: 09/01/15 General:   Alert,  Well-developed, well-nourished, pleasant and cooperative in NAD Head:  Normocephalic and atraumatic. Eyes:  Sclera clear, no icterus.  Abdomen:  Soft, mild epig tenderness and nondistended.   Extremities:  Without clubbing, deformity or edema. Neurologic:  Alert and  oriented x4;  grossly normal neurologically. Skin:  Intact without significant lesions or rashes. Psych:  Alert and cooperative. Normal mood and affect.  Intake/Output from previous day: 08/23 0701 - 08/24 0700 In: 720 [P.O.:720] Out: 3000 [Urine:3000] Intake/Output this shift: No intake/output data recorded.  Lab Results: CBC  Recent Labs  09/05/15 1423 09/06/15 0704  WBC 8.7 6.9  HGB 14.0 12.4  HCT 40.9 37.0  MCV 90.5 91.6  PLT 265 223   BMET  Recent Labs  09/05/15 1423 09/06/15 0704  NA 134* 136  K 3.4* 4.1  CL 110 109  CO2 18* 24  GLUCOSE 113* 114*  BUN 11 9  CREATININE 0.72 0.84  CALCIUM 8.9 8.8*   LFTs  Recent Labs  09/05/15 1423  BILITOT 0.6  ALKPHOS 73  AST 18  ALT 14  PROT 7.9  ALBUMIN 4.6    Recent Labs  09/05/15 1423  LIPASE 26   PT/INR No results for input(s): LABPROT, INR in the last 72 hours.    Imaging Studies: Ct Abdomen Pelvis W Contrast  Result Date: 08/31/2015 CLINICAL DATA:  Acute onset of generalized abdominal pain and vomiting. Initial encounter. EXAM: CT ABDOMEN AND PELVIS WITH CONTRAST  TECHNIQUE: Multidetector CT imaging of the abdomen and pelvis was performed using the standard protocol following bolus administration of intravenous contrast. CONTRAST:  100 mL of Isovue 300 IV contrast COMPARISON:  CT of the chest, abdomen and pelvis performed 10/22/2011 FINDINGS: The visualized lung bases are clear. The liver and spleen are unremarkable in appearance. The gallbladder is within normal limits. The pancreas and adrenal glands are unremarkable. The kidneys are unremarkable in appearance. There is no evidence of hydronephrosis. No renal or ureteral stones are seen. No perinephric stranding is appreciated. The duodenum appears somewhat thick walled, though this is nonspecific. Would correlate for any evidence of duodenitis. The remaining small bowel is grossly unremarkable. The stomach is within normal limits. No acute vascular abnormalities are seen. The appendix appears to be normal in caliber, containing a small amount of contrast, without definite evidence for appendicitis. However, a small amount of free fluid is noted at the right lower quadrant, of uncertain significance. The colon is largely decompressed and unremarkable in appearance. The bladder is mildly distended and grossly unremarkable. The uterus is grossly unremarkable in appearance. The ovaries are relatively symmetric. No suspicious adnexal masses are seen. No inguinal lymphadenopathy is seen. A metallic piercing is noted at the labia. No acute osseous abnormalities are identified. IMPRESSION: 1. Small amount of fluid at the right lower quadrant, of uncertain significance. The appendix appears to be normal in caliber, without  definite evidence for appendicitis. 2. Duodenum appears somewhat thick walled, though this is nonspecific. Would correlate for any evidence of duodenitis. Electronically Signed   By: Garald Balding M.D.   On: 08/31/2015 01:20  [2 weeks]   Assessment: 20 y/o female with epigastric pain associated with  vomiting. CT with ?duodenitis. H.Pylori serologies positive in urgent care and completed three days of amoxicillin/biaxin/omeprazole prior to admission. EGD with mild gastritis s/p biopsy. Gastric and duodenal biopsies obtained. TTG was normal. Patient with significant improvement. Pain improved, explained to patient that goal is for pain to be manageable, not to be pain free at time of discharge. Her pain will gradually resolve. She is tolerating her diet.   Plan: 1. F/u pending path. 2. Complete H.pylori treatment if meds tolerated. If not, she can stop and we can retreat at later date once she is feeling better. Patient and mom voice understanding. 3. Change scheduled IV zofran to oral prn. 4. Encouraged her to use oral pain medication first for pain. Increase dose as she feels current dose ineffective.  5. Patient refused labs this morning per nursing staff.  6. From a GI standpoint, she is stable for discharge.   Laureen Ochs. Bernarda Caffey [facility] [phone] 8/24/20178:55 AM     LOS: 3 days

## 2015-09-09 ENCOUNTER — Encounter (HOSPITAL_COMMUNITY): Payer: Self-pay | Admitting: Gastroenterology

## 2015-09-09 NOTE — Telephone Encounter (Signed)
PT is aware.

## 2015-09-12 ENCOUNTER — Telehealth: Payer: Self-pay

## 2015-09-12 NOTE — Telephone Encounter (Signed)
Mother called and said they did get the Pantoprazole.

## 2015-09-12 NOTE — Telephone Encounter (Signed)
Pt's mom left Vm after we left on Friday, they were having problems getting the Pantoprazole for pt.  I have returned the call and left Vm for a return call.

## 2015-09-14 ENCOUNTER — Encounter (HOSPITAL_COMMUNITY): Payer: Self-pay

## 2015-09-14 ENCOUNTER — Encounter (HOSPITAL_COMMUNITY): Payer: Self-pay | Admitting: *Deleted

## 2015-09-14 ENCOUNTER — Telehealth: Payer: Self-pay | Admitting: Gastroenterology

## 2015-09-14 ENCOUNTER — Emergency Department (HOSPITAL_COMMUNITY)
Admission: EM | Admit: 2015-09-14 | Discharge: 2015-09-14 | Disposition: A | Discharge status: Home / Self Care | Payer: 59 | Attending: Emergency Medicine | Admitting: Emergency Medicine

## 2015-09-14 ENCOUNTER — Emergency Department (HOSPITAL_COMMUNITY): Payer: 59

## 2015-09-14 DIAGNOSIS — R1013 Epigastric pain: Secondary | ICD-10-CM | POA: Diagnosis not present

## 2015-09-14 DIAGNOSIS — R197 Diarrhea, unspecified: Secondary | ICD-10-CM | POA: Diagnosis not present

## 2015-09-14 DIAGNOSIS — G43009 Migraine without aura, not intractable, without status migrainosus: Secondary | ICD-10-CM

## 2015-09-14 DIAGNOSIS — R112 Nausea with vomiting, unspecified: Secondary | ICD-10-CM

## 2015-09-14 DIAGNOSIS — R740 Nonspecific elevation of levels of transaminase and lactic acid dehydrogenase [LDH]: Secondary | ICD-10-CM | POA: Insufficient documentation

## 2015-09-14 DIAGNOSIS — Z79899 Other long term (current) drug therapy: Secondary | ICD-10-CM | POA: Insufficient documentation

## 2015-09-14 DIAGNOSIS — F17210 Nicotine dependence, cigarettes, uncomplicated: Secondary | ICD-10-CM | POA: Insufficient documentation

## 2015-09-14 DIAGNOSIS — Z792 Long term (current) use of antibiotics: Secondary | ICD-10-CM | POA: Insufficient documentation

## 2015-09-14 DIAGNOSIS — R51 Headache: Secondary | ICD-10-CM | POA: Diagnosis present

## 2015-09-14 DIAGNOSIS — G43909 Migraine, unspecified, not intractable, without status migrainosus: Secondary | ICD-10-CM | POA: Diagnosis not present

## 2015-09-14 DIAGNOSIS — R748 Abnormal levels of other serum enzymes: Secondary | ICD-10-CM

## 2015-09-14 LAB — COMPREHENSIVE METABOLIC PANEL
ALT: 103 U/L — ABNORMAL HIGH (ref 14–54)
AST: 84 U/L — ABNORMAL HIGH (ref 15–41)
Albumin: 4.1 g/dL (ref 3.5–5.0)
Alkaline Phosphatase: 77 U/L (ref 38–126)
Anion gap: 4 — ABNORMAL LOW (ref 5–15)
BUN: 11 mg/dL (ref 6–20)
CO2: 21 mmol/L — ABNORMAL LOW (ref 22–32)
Calcium: 8.9 mg/dL (ref 8.9–10.3)
Chloride: 112 mmol/L — ABNORMAL HIGH (ref 101–111)
Creatinine, Ser: 0.7 mg/dL (ref 0.44–1.00)
GFR calc Af Amer: >60 mL/min (ref >60)
GFR calc non Af Amer: >60 mL/min (ref >60)
Glucose, Bld: 148 mg/dL — ABNORMAL HIGH (ref 65–99)
Potassium: 3.6 mmol/L (ref 3.5–5.1)
Sodium: 137 mmol/L (ref 135–145)
Total Bilirubin: 0.5 mg/dL (ref 0.3–1.2)
Total Protein: 7.6 g/dL (ref 6.5–8.1)

## 2015-09-14 LAB — CBC WITH DIFFERENTIAL/PLATELET
Basophils Absolute: 0 10*3/uL (ref 0.0–0.1)
Basophils Relative: 0 %
Eosinophils Absolute: 0 10*3/uL (ref 0.0–0.7)
Eosinophils Relative: 0 %
HCT: 39.1 % (ref 36.0–46.0)
Hemoglobin: 13 g/dL (ref 12.0–15.0)
Lymphocytes Relative: 11 %
Lymphs Abs: 0.9 10*3/uL (ref 0.7–4.0)
MCH: 30.4 pg (ref 26.0–34.0)
MCHC: 33.2 g/dL (ref 30.0–36.0)
MCV: 91.4 fL (ref 78.0–100.0)
Monocytes Absolute: 0.2 10*3/uL (ref 0.1–1.0)
Monocytes Relative: 2 %
Neutro Abs: 7.3 10*3/uL (ref 1.7–7.7)
Neutrophils Relative %: 87 %
Platelets: 259 10*3/uL (ref 150–400)
RBC: 4.28 MIL/uL (ref 3.87–5.11)
RDW: 13.7 % (ref 11.5–15.5)
WBC: 8.4 10*3/uL (ref 4.0–10.5)

## 2015-09-14 LAB — URINALYSIS, ROUTINE W REFLEX MICROSCOPIC
Bilirubin Urine: NEGATIVE
Glucose, UA: NEGATIVE mg/dL
Hgb urine dipstick: NEGATIVE
Ketones, ur: NEGATIVE mg/dL
Leukocytes, UA: NEGATIVE
Nitrite: NEGATIVE
Protein, ur: NEGATIVE mg/dL
Specific Gravity, Urine: 1.025 (ref 1.005–1.030)
pH: 6 (ref 5.0–8.0)

## 2015-09-14 LAB — LIPASE, BLOOD: LIPASE: 24 U/L (ref 11–51)

## 2015-09-14 LAB — PREGNANCY, URINE: Preg Test, Ur: NEGATIVE

## 2015-09-14 MED ORDER — KETOROLAC TROMETHAMINE 30 MG/ML IJ SOLN
30.0000 mg | Freq: Once | INTRAMUSCULAR | Status: AC
Start: 2015-09-14 — End: 2015-09-14
  Administered 2015-09-14: 30 mg via INTRAVENOUS
  Filled 2015-09-14: qty 1

## 2015-09-14 MED ORDER — SODIUM CHLORIDE 0.9 % IV BOLUS (SEPSIS)
1000.0000 mL | Freq: Once | INTRAVENOUS | Status: AC
Start: 2015-09-14 — End: 2015-09-14
  Administered 2015-09-14: 1000 mL via INTRAVENOUS

## 2015-09-14 MED ORDER — METOCLOPRAMIDE HCL 5 MG/ML IJ SOLN
10.0000 mg | Freq: Once | INTRAMUSCULAR | Status: AC
  Administered 2015-09-14: 10 mg via INTRAVENOUS
  Filled 2015-09-14: qty 2

## 2015-09-14 MED ORDER — DEXAMETHASONE SODIUM PHOSPHATE 10 MG/ML IJ SOLN
10.0000 mg | Freq: Once | INTRAMUSCULAR | Status: AC
  Administered 2015-09-14: 10 mg via INTRAVENOUS
  Filled 2015-09-14: qty 1

## 2015-09-14 MED ORDER — PROMETHAZINE HCL 25 MG PO TABS: 25.0000 mg | ORAL_TABLET | Freq: Four times a day (QID) | ORAL | 0 refills | Status: DC | PRN

## 2015-09-14 MED ORDER — ONDANSETRON 4 MG PO TBDP: 4.0000 mg | ORAL_TABLET | Freq: Three times a day (TID) | ORAL | 0 refills | Status: DC | PRN

## 2015-09-14 MED ORDER — SODIUM CHLORIDE 0.9 % IV BOLUS (SEPSIS)
1000.0000 mL | Freq: Once | INTRAVENOUS | Status: AC
  Administered 2015-09-14: 1000 mL via INTRAVENOUS

## 2015-09-14 MED ORDER — DIPHENHYDRAMINE HCL 50 MG/ML IJ SOLN
50.0000 mg | Freq: Once | INTRAMUSCULAR | Status: AC
  Administered 2015-09-14: 50 mg via INTRAVENOUS
  Filled 2015-09-14: qty 1

## 2015-09-14 MED ORDER — MORPHINE SULFATE (PF) 4 MG/ML IV SOLN
4.0000 mg | Freq: Once | INTRAVENOUS | Status: AC
  Administered 2015-09-14: 4 mg via INTRAVENOUS
  Filled 2015-09-14: qty 1

## 2015-09-14 MED ORDER — ONDANSETRON HCL 4 MG/2ML IJ SOLN
4.0000 mg | Freq: Once | INTRAMUSCULAR | Status: AC
  Administered 2015-09-14: 4 mg via INTRAVENOUS
  Filled 2015-09-14: qty 2

## 2015-09-14 MED ORDER — SUMATRIPTAN SUCCINATE 100 MG PO TABS: 100.0000 mg | ORAL_TABLET | ORAL | 0 refills | Status: DC | PRN

## 2015-09-14 MED ORDER — OXYCODONE-ACETAMINOPHEN 5-325 MG PO TABS: 1.0000 | ORAL_TABLET | ORAL | 0 refills | Status: DC | PRN

## 2015-09-14 MED ORDER — ACETAMINOPHEN 500 MG PO TABS
1000.0000 mg | ORAL_TABLET | Freq: Once | ORAL | Status: AC
  Administered 2015-09-14: 1000 mg via ORAL
  Filled 2015-09-14: qty 2

## 2015-09-14 MED ORDER — SUMATRIPTAN SUCCINATE 6 MG/0.5ML ~~LOC~~ SOLN
6.0000 mg | Freq: Once | SUBCUTANEOUS | Status: AC
  Administered 2015-09-14: 6 mg via SUBCUTANEOUS
  Filled 2015-09-14: qty 0.5

## 2015-09-14 NOTE — ED Triage Notes (Signed)
Pt c/o severe headache that started today; pt states she has been vomiting today; pt states she has been taking multiple antibiotics for h pylori, pt states she was diagnosed last week
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
## 2015-09-14 NOTE — Discharge Instructions (Signed)
Complete your course of the medicines you are currently taking and followup with Dr. Oneida Alar as discussed.

## 2015-09-14 NOTE — ED Triage Notes (Signed)
Pt reports was admitted last Monday for gastritis and h pylori.  Pt says she started vomiting and had a headache last night so she came to the er.  Pt says she doesn't feel any better.

## 2015-09-14 NOTE — ED Notes (Signed)
Pt consumed 60cc of water with no n/v.  md notified

## 2015-09-14 NOTE — Telephone Encounter (Signed)
I spoke with the pt- she was seen last week and admitted to [facility] with N/V and abd pain. They kept her for 3 days and let her go home. Pt now has N/V (vomiting all day, cant keep anything down, not even fluids), abd pain (right under ribs) and now has diarrhea (4x today). Could not tell me if she had a fever but stated she has cold chills. Asked her if she has seen any blood in her stool and she said she hasn't looked. This has been happening on and off since she left the hospital. When I told her that I would send a note to the provider and if she got worse to go to the ED, she said she went to the ED this morning with a headache that she said was d/t the vomiting. She said vomited 2x while in the ED and they gave her something for nausea and sent her home.  She said she feels just like she did when she was here on the 21st. I told her again that I would speak with the provider and if she got worse she needed to go back to the ED. Pt said ok and hung up.   Her number is [phone].

## 2015-09-14 NOTE — Telephone Encounter (Signed)
Noted  

## 2015-09-14 NOTE — Telephone Encounter (Signed)
Pt's grandmother Ruffin Pyo) called to say that patient was seen by [place] last week and was admitted to the hospital. Pt was discharged and is still having lots of problems and is in pain. Patient's mother is out of town and doesn't know what to do for the patient. Please advise and call [phone]

## 2015-09-14 NOTE — Telephone Encounter (Signed)
Patient went back to er.

## 2015-09-15 LAB — HEPATITIS PANEL, ACUTE
HEP A IGM: NEGATIVE
HEP B C IGM: NEGATIVE
Hepatitis B Surface Ag: NEGATIVE

## 2015-09-16 ENCOUNTER — Ambulatory Visit (INDEPENDENT_AMBULATORY_CARE_PROVIDER_SITE_OTHER): Payer: 59 | Admitting: Gastroenterology

## 2015-09-16 ENCOUNTER — Encounter: Payer: Self-pay | Admitting: Gastroenterology

## 2015-09-16 DIAGNOSIS — R7989 Other specified abnormal findings of blood chemistry: Secondary | ICD-10-CM | POA: Diagnosis not present

## 2015-09-16 DIAGNOSIS — R1010 Upper abdominal pain, unspecified: Secondary | ICD-10-CM

## 2015-09-16 DIAGNOSIS — R945 Abnormal results of liver function studies: Secondary | ICD-10-CM

## 2015-09-16 DIAGNOSIS — R109 Unspecified abdominal pain: Secondary | ICD-10-CM | POA: Insufficient documentation

## 2015-09-16 NOTE — Patient Instructions (Addendum)
We are referring you to Dr. Arnoldo Morale Dr. Rosana Hoes as soon as possible for an opinion.  Continue Protonix as you are doing!  I hope you start feeling better soon!
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
## 2015-09-20 ENCOUNTER — Telehealth: Payer: Self-pay | Admitting: Gastroenterology

## 2015-09-20 ENCOUNTER — Encounter: Payer: Self-pay | Admitting: Gastroenterology

## 2015-09-20 ENCOUNTER — Other Ambulatory Visit: Payer: Self-pay

## 2015-09-20 DIAGNOSIS — R7989 Other specified abnormal findings of blood chemistry: Secondary | ICD-10-CM

## 2015-09-20 DIAGNOSIS — R945 Abnormal results of liver function studies: Principal | ICD-10-CM

## 2015-09-20 DIAGNOSIS — R109 Unspecified abdominal pain: Secondary | ICD-10-CM

## 2015-09-20 NOTE — Assessment & Plan Note (Signed)
20 year old with epigastric pain, N/V, with EGD completed noting gastritis. Now with bump in LFTs, normal [place]. Viral markers negative. Gallbladder remains in situ. Discussed proceeding with HIDA, but patient and mother would like to get a surgical opinion first.Will refer to general surgery and recheck LFTs first of next week. Patient denies ETOH. Discussed avoidance of fatty/fried foods and following a bland diet. If persistent elevation in LFTs, will need further work-up. Continue PPI and complete treatment for positive H.pylori serology.

## 2015-09-20 NOTE — Telephone Encounter (Signed)
I forgot to order repeat LFTs for patient. We need to check this today or tomorrow if at all possible to ensure her LFTs are trending down. I meant to order this at her appt. Thanks!
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
## 2015-09-20 NOTE — Telephone Encounter (Signed)
Order has been entered and pt is aware to go to the lab today or tomorrow.

## 2015-09-21 ENCOUNTER — Other Ambulatory Visit: Payer: Self-pay

## 2015-09-21 ENCOUNTER — Telehealth: Payer: Self-pay

## 2015-09-21 ENCOUNTER — Inpatient Hospital Stay (HOSPITAL_COMMUNITY): Admission: RE | Admit: 2015-09-21 | Payer: 59 | Source: Ambulatory Visit

## 2015-09-21 DIAGNOSIS — R109 Unspecified abdominal pain: Secondary | ICD-10-CM

## 2015-09-21 DIAGNOSIS — R112 Nausea with vomiting, unspecified: Secondary | ICD-10-CM

## 2015-09-21 LAB — HEPATIC FUNCTION PANEL
ALBUMIN: 4.8 g/dL (ref 3.6–5.1)
ALT: 97 U/L — AB (ref 6–29)
AST: 43 U/L — ABNORMAL HIGH (ref 10–30)
Alkaline Phosphatase: 89 U/L (ref 33–115)
BILIRUBIN INDIRECT: 0.3 mg/dL (ref 0.2–1.2)
Bilirubin, Direct: 0.1 mg/dL (ref <=0.2)
TOTAL PROTEIN: 8.2 g/dL — AB (ref 6.1–8.1)
Total Bilirubin: 0.4 mg/dL (ref 0.2–1.2)

## 2015-09-21 NOTE — Telephone Encounter (Signed)
Pt took pain medication this morning at 9:00 am so there fore she will have to wait until tomorrow to have it done. Waiting on a call back from NM to get a time.

## 2015-09-21 NOTE — Telephone Encounter (Signed)
We can order a HIDA today. Tell them I'm sorry: was trying to expedite things and looks like I just made it slower.

## 2015-09-21 NOTE — Telephone Encounter (Signed)
Pt is set up for HIDA in the morning at 9:45. Mother is aware.

## 2015-09-21 NOTE — Telephone Encounter (Signed)
PA#  For HIDA  [id number]

## 2015-09-21 NOTE — Telephone Encounter (Signed)
Pt's mother called and said that Dr.Jenkins wants her to have a HIDA scan done before they will see her.Please advise

## 2015-09-22 ENCOUNTER — Encounter (HOSPITAL_COMMUNITY)
Admission: RE | Admit: 2015-09-22 | Discharge: 2015-09-22 | Disposition: A | Discharge status: Home / Self Care | Payer: 59 | Source: Ambulatory Visit | Attending: Gastroenterology | Admitting: Gastroenterology

## 2015-09-22 ENCOUNTER — Encounter (HOSPITAL_COMMUNITY): Payer: Self-pay

## 2015-09-22 DIAGNOSIS — R109 Unspecified abdominal pain: Secondary | ICD-10-CM | POA: Insufficient documentation

## 2015-09-22 DIAGNOSIS — R112 Nausea with vomiting, unspecified: Secondary | ICD-10-CM

## 2015-09-22 MED ORDER — TECHNETIUM TC 99M MEBROFENIN IV KIT
5.0000 | PACK | Freq: Once | INTRAVENOUS | Status: AC | PRN
  Administered 2015-09-22: 5 via INTRAVENOUS

## 2015-09-22 NOTE — Progress Notes (Signed)
Pt did not have pain with the HIDA. She has not completed the meds for H Pylori. She said hospital took her off for awhile and then she took herself off. She still has she thinks about 3 days worth left.  She started over some on the antibiotics.  She still has some of the pain that she previously had between her rib cage, and sometimes lower abdomen, but not as bad as before.  Please advise!

## 2015-09-22 NOTE — Progress Notes (Signed)
HIDA is normal. DID SHE HAVE PAIN WITH THIS? Is she still on the antibiotics for H.pylori positive serology? If so, how many are left? There is a small chance the antibiotics could cause a bump in her LFTs but I am not convinced that is the culprit. However, we need to make sure she has either completed or remove any possibility of med effect with LFTs. Her LFTs are coming down, but they are still elevated. Is she in pain now? Hep C, Hep B negative. How are her symptoms?

## 2015-09-26 NOTE — Progress Notes (Signed)
It is good to know pain is improving. I am discussing with Dr. Oneida Alar.

## 2015-09-29 NOTE — Progress Notes (Signed)
Forwarding to Anna Boone, NP.  

## 2015-10-03 NOTE — Progress Notes (Signed)
How is patient doing? I know she wanted to get back to [place]. Recommend continuing PPI and if she needs to return to [place], would arrange close follow-up with GI there. Needs to have repeat LFTs in 4 weeks if still here, otherwise would complete in [place] through PCP and have further follow-up down there.

## 2015-10-03 NOTE — Progress Notes (Signed)
LMOM to call.

## 2015-10-06 NOTE — Progress Notes (Signed)
Pt said she is doing much better. She is taking the Protonix bid most of the time. She said she will follow up with GI in [place].

## 2015-11-02 ENCOUNTER — Other Ambulatory Visit: Payer: Self-pay | Admitting: Adult Health

## 2015-11-10 ENCOUNTER — Telehealth: Payer: Self-pay | Admitting: Gastroenterology

## 2015-11-10 NOTE — Telephone Encounter (Signed)
Kaitlyn Garza:   Is patient back in [place] or does she live here now? Reviewed lab trend with Dr. Oneida Alar. Recommend autoimmune labs.   Needs AMA, ANA, ASMA, immunoglobulins. If she lives in [place] now, will need to establish care with GI there for further evaluation, as her LFTs continue to fluctuate and we need to rule out autoimmune hepatitis.

## 2015-11-11 ENCOUNTER — Other Ambulatory Visit: Payer: Self-pay

## 2015-11-11 DIAGNOSIS — R748 Abnormal levels of other serum enzymes: Secondary | ICD-10-CM

## 2015-11-11 NOTE — Telephone Encounter (Signed)
Pt has moved, but will be back here between 11/6-11/20 and would like to do the labs here. Putting in for [facility] and she is aware to go sometime while she is here.

## 2017-01-10 ENCOUNTER — Ambulatory Visit: Payer: 59 | Admitting: Adult Health

## 2017-01-10 ENCOUNTER — Encounter: Payer: Self-pay | Admitting: Adult Health

## 2017-01-10 VITALS — BP 140/100 | HR 83 | Ht 62.0 in | Wt 146.5 lb

## 2017-01-10 DIAGNOSIS — B379 Candidiasis, unspecified: Secondary | ICD-10-CM | POA: Diagnosis not present

## 2017-01-10 DIAGNOSIS — Z30011 Encounter for initial prescription of contraceptive pills: Secondary | ICD-10-CM

## 2017-01-10 DIAGNOSIS — N898 Other specified noninflammatory disorders of vagina: Secondary | ICD-10-CM | POA: Diagnosis not present

## 2017-01-10 LAB — POCT WET PREP (WET MOUNT): WBC, Wet Prep HPF POC: POSITIVE

## 2017-01-10 MED ORDER — FLUCONAZOLE 150 MG PO TABS: ORAL_TABLET | ORAL | 0 refills | Status: DC

## 2017-01-10 MED ORDER — NORETHIN-ETH ESTRAD-FE BIPHAS 1 MG-10 MCG / 10 MCG PO TABS: 1.0000 | ORAL_TABLET | Freq: Every day | ORAL | 11 refills | Status: DC

## 2017-01-10 NOTE — Progress Notes (Signed)
Subjective:     Patient ID: Kaitlyn Garza, female   DOB: Jul 25, 1995, 21 y.o.   MRN: [id number]  HPI Kaitlyn Garza is a 21 year old biracial female in complaining of vaginal discharge, feels irritated, has used sisters soap while in visiting from [place].Has pain at  C-section scar, had in May 2018.And wants to get on birth control.   Review of Systems +vaginal discharge  Pain at C section scar Reviewed past medical,surgical, social and family history. Reviewed medications and allergies.     Objective:   Physical Exam BP (!) 140/100 (BP Location: Left Arm, Patient Position: Sitting, Cuff Size: Normal)   Pulse 83   Ht 5\' 2"  (1.575 m)   Wt 146 lb 8 oz (66.5 kg)   LMP 12/16/2016 (Approximate)   BMI 26.80 kg/m  Skin warm and dry.Pelvic: external genitalia is normal in appearance no lesions, vagina: white,clumpy discharge without odor,side walls red,urethra has no lesions or masses noted, cervix:smooth, uterus: normal size, shape and contour, non tender, no masses felt, adnexa: no masses or tenderness noted. Bladder is non tender and no masses felt. Wet prep: + for yeast and +WBCs. GC/CHL obtained.  Abdomen is soft and non tender, has small well healed C-section scar, told will take months to feel normal     Assessment:     1. Vaginal discharge   2. Yeast infection   3. Encounter for initial prescription of contraceptive pills       Plan:    Start Lo Loestrin with next period and use condoms  Meds ordered this encounter  Medications  . Norethindrone-Ethinyl Estradiol-Fe Biphas (LO LOESTRIN FE) 1 MG-10 MCG / 10 MCG tablet    Sig: Take 1 tablet by mouth daily. Take 1 daily by mouth    Dispense:  1 Package    Refill:  11    BIN [id number], PCN [id number], GRP [id number]    Order Specific Question:   Supervising Provider    Answer:   Tania Ade H [[id number]]  . fluconazole (DIFLUCAN) 150 MG tablet    Sig: Take 1 now and 1 in 3 days    Dispense:  2 tablet    Refill:  0    Order Specific  Question:   Supervising Provider    Answer:   Florian Buff [[id number]]  GC/CHL sent  Do not use soap to bath  Follow up prn

## 2017-01-12 LAB — GC/CHLAMYDIA PROBE AMP
CHLAMYDIA, DNA PROBE: NEGATIVE
Neisseria gonorrhoeae by PCR: NEGATIVE

## 2017-01-14 ENCOUNTER — Telehealth: Payer: Self-pay | Admitting: Adult Health

## 2017-01-14 MED ORDER — METRONIDAZOLE 500 MG PO TABS: 500.0000 mg | ORAL_TABLET | Freq: Two times a day (BID) | ORAL | 0 refills | Status: DC

## 2017-01-14 NOTE — Telephone Encounter (Signed)
Pt aware GC/chlamydia both negative, she says smells fishy now, will rx flagyl, and will come back next Monday for BP check is on meds but does not know the name

## 2017-01-21 ENCOUNTER — Ambulatory Visit: Payer: 59 | Admitting: Adult Health

## 2017-03-18 IMAGING — US US ABDOMEN COMPLETE
1 series · 13 of 25 positions shown · non-contrast
Comparison: Abdominal and pelvic CT scan August 31, 2015

CLINICAL DATA: Onset of nausea and vomiting with diarrhea today
associated with epigastric pain. The patient was treated for H
pylori last week

EXAM:
ABDOMEN ULTRASOUND COMPLETE

[Series 1: us abdomen complete · 0.22mm/px · 13 of 93 slices shown]
[im 1/93]
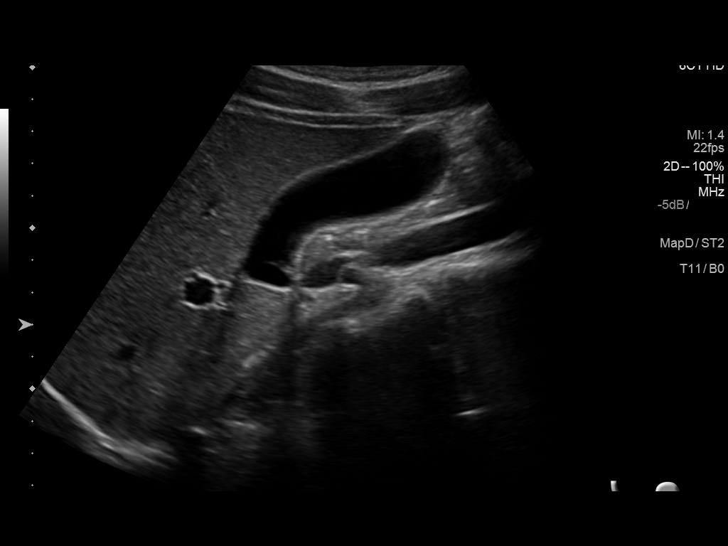
[im 8/93]
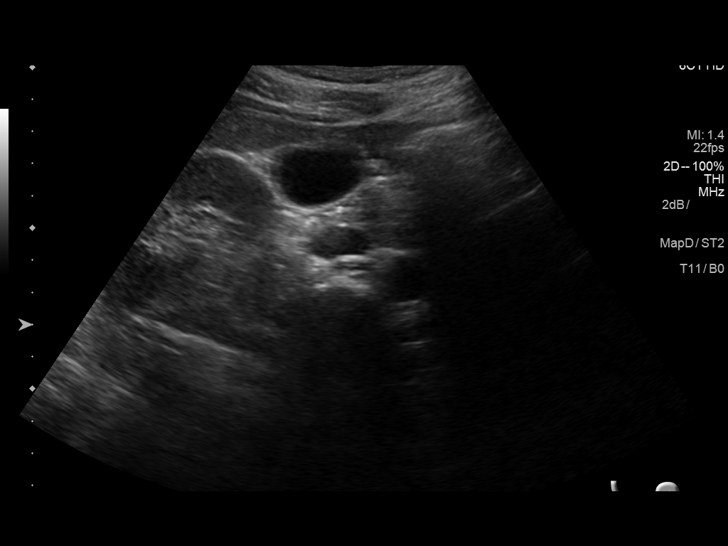
[im 16/93]
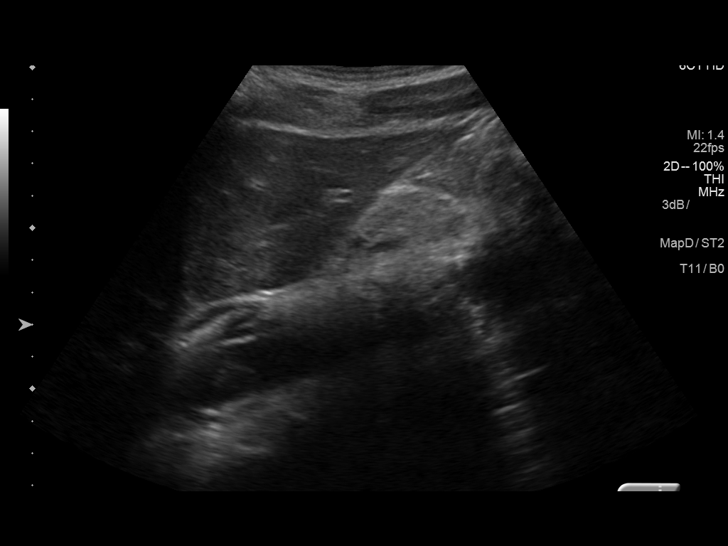
[im 24/93]
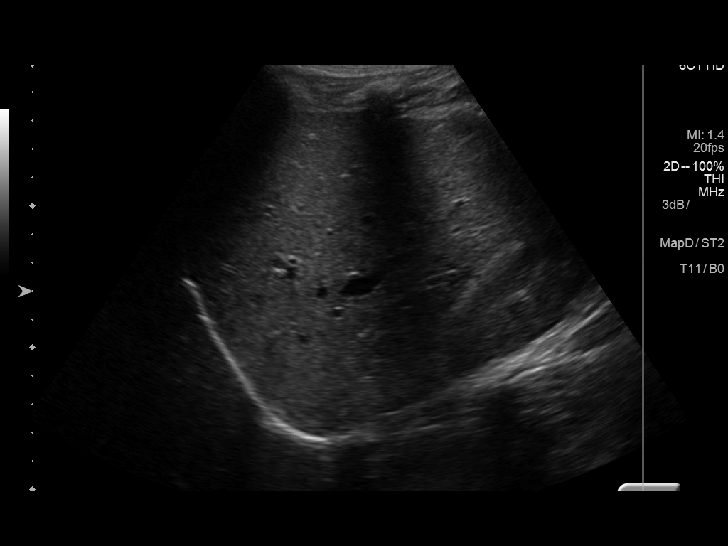
[im 31/93]
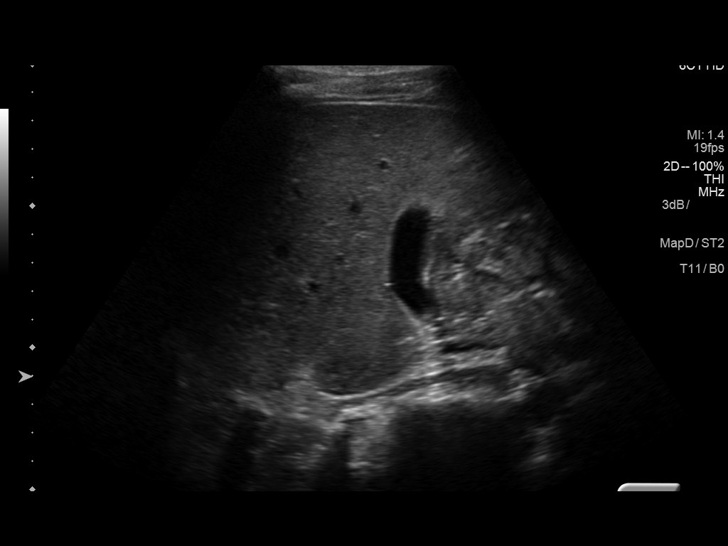
[im 39/93]
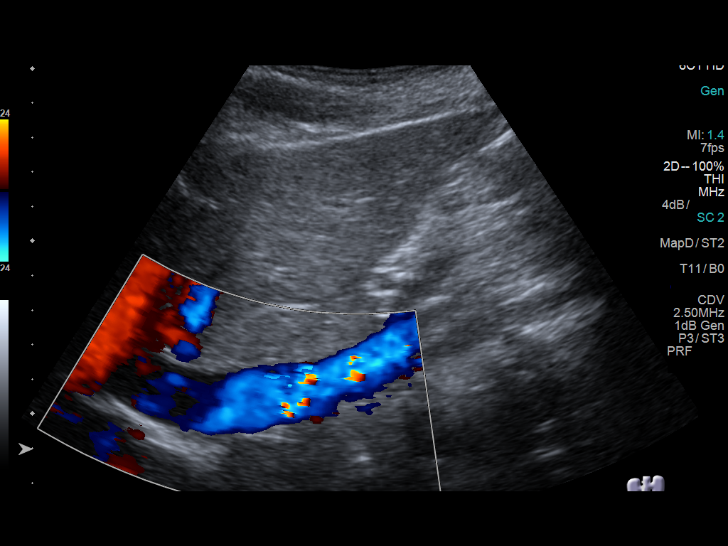
[im 47/93]
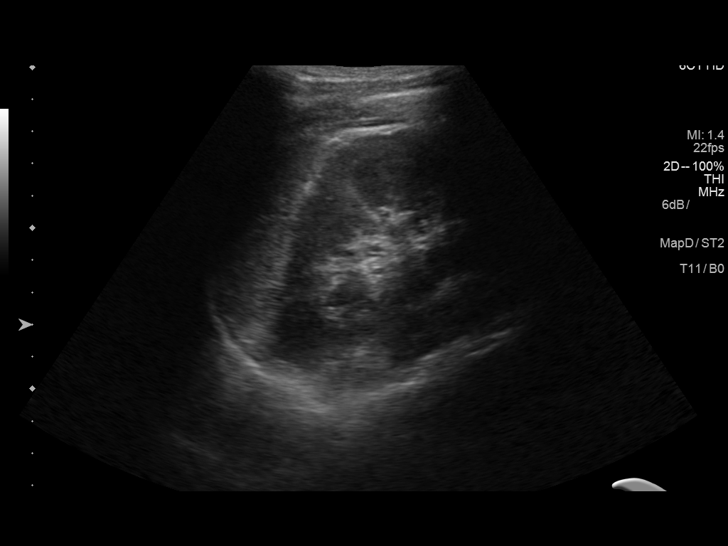
[im 54/93]
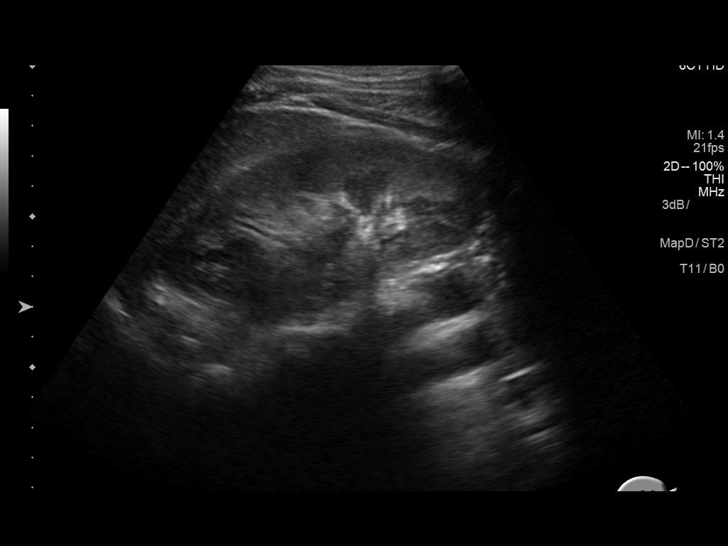
[im 62/93]
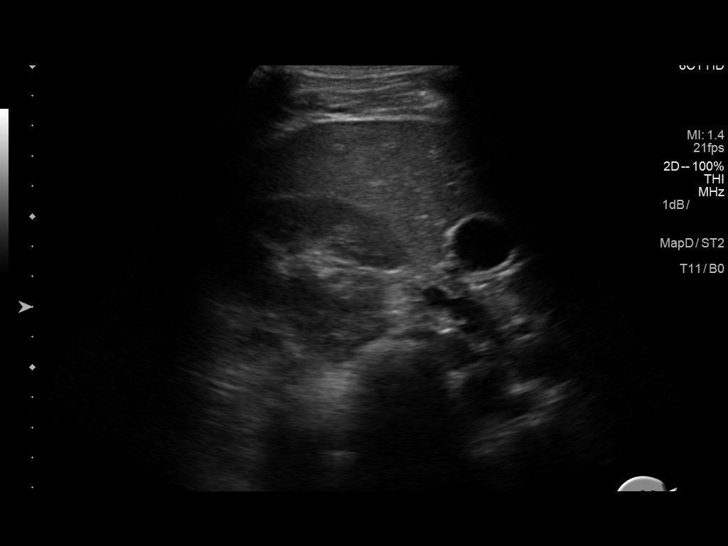
[im 70/93]
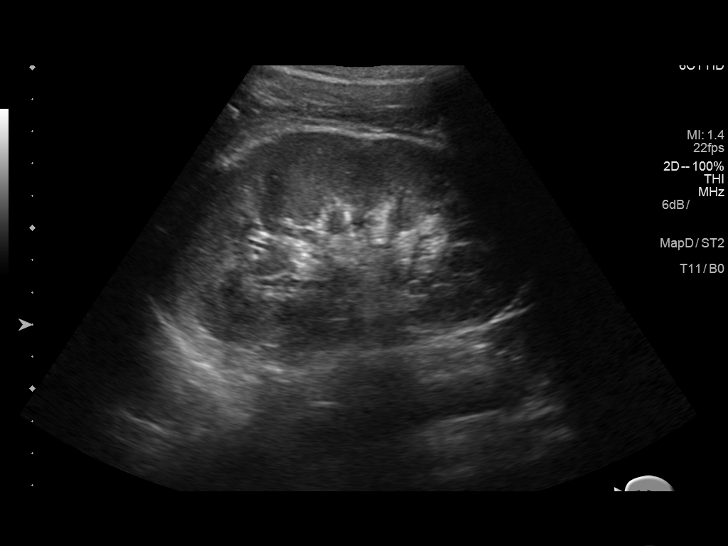
[im 77/93]
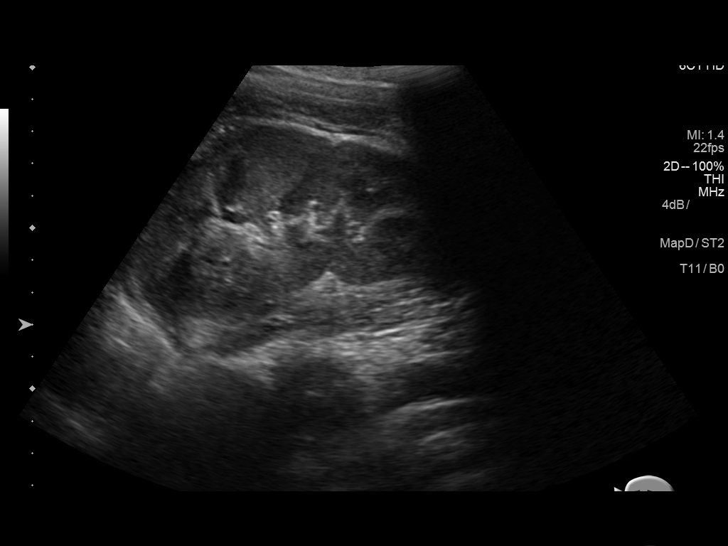
[im 85/93]
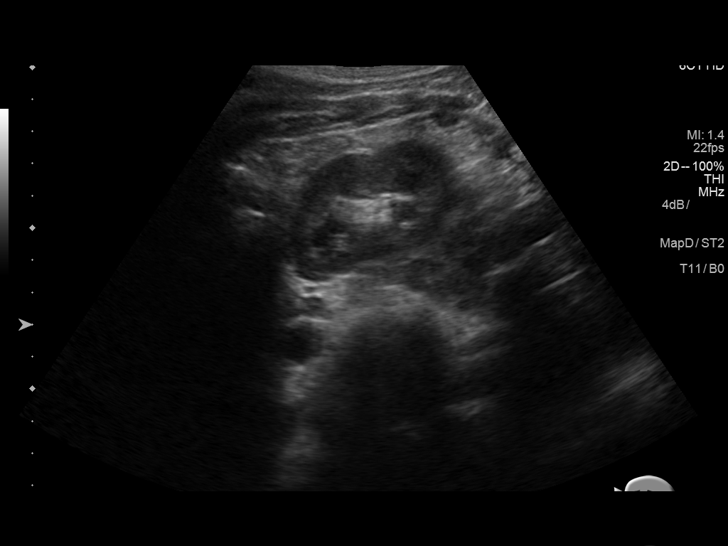
[im 93/93]
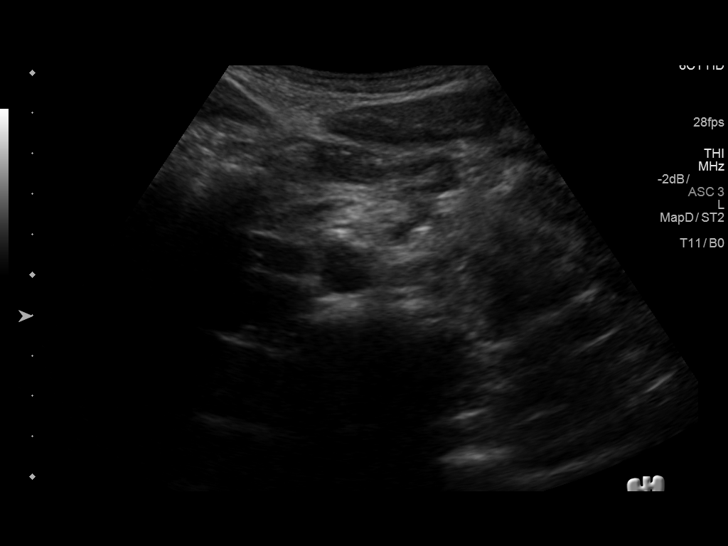

[13 of 25 positions shown; findings below may reference images not displayed]

FINDINGS: Gallbladder: No gallstones or wall thickening visualized. No
sonographic Murphy sign noted by sonographer.

Common bile duct: Diameter: 4 mm

Liver: No focal lesion identified. Within normal limits in
parenchymal echogenicity.

IVC: No abnormality visualized.

Pancreas: Visualized portion unremarkable.

Spleen: Size and appearance within normal limits.

Right Kidney: Length: 11.2 cm. Echogenicity within normal limits. No
mass or hydronephrosis visualized.

Left Kidney: Length: 11.2 cm.. Echogenicity within normal limits. No
mass or hydronephrosis visualized. A 5 mm non obstructing midpole
stone is suspected.

Abdominal aorta: No aneurysm visualized.

Other findings: None.
IMPRESSION: 1. No gallstones or sonographic evidence of acute cholecystitis. If
there is strong clinical concern of gallbladder dysfunction, a
nuclear medicine hepatobiliary scan may be useful.
2. Probable 5 mm nonobstructing midpole stone in the left kidney.
Correlation with the patient's clinical examination and urinalysis
is recommended.

## 2017-08-05 ENCOUNTER — Encounter: Payer: Self-pay | Admitting: Adult Health

## 2017-08-05 ENCOUNTER — Ambulatory Visit: Payer: 59 | Admitting: Adult Health

## 2017-08-05 VITALS — BP 142/88 | HR 85 | Ht 62.0 in | Wt 140.0 lb

## 2017-08-05 DIAGNOSIS — Z308 Encounter for other contraceptive management: Secondary | ICD-10-CM | POA: Diagnosis not present

## 2017-08-05 DIAGNOSIS — Z3009 Encounter for other general counseling and advice on contraception: Secondary | ICD-10-CM | POA: Diagnosis not present

## 2017-08-05 DIAGNOSIS — N898 Other specified noninflammatory disorders of vagina: Secondary | ICD-10-CM | POA: Diagnosis not present

## 2017-08-05 DIAGNOSIS — N760 Acute vaginitis: Secondary | ICD-10-CM | POA: Diagnosis not present

## 2017-08-05 DIAGNOSIS — B9689 Other specified bacterial agents as the cause of diseases classified elsewhere: Secondary | ICD-10-CM

## 2017-08-05 LAB — POCT WET PREP (WET MOUNT)
Clue Cells Wet Prep Whiff POC: POSITIVE
WBC, Wet Prep HPF POC: POSITIVE

## 2017-08-05 MED ORDER — METRONIDAZOLE 0.75 % VA GEL: 1.0000 | Freq: Every day | VAGINAL | 0 refills | Status: AC

## 2017-08-05 NOTE — Progress Notes (Signed)
  Subjective:     Patient ID: Kaitlyn Garza, female   DOB: [birth year], 22 y.o.   MRN: [id number]  HPI Kaitlyn Garza is a 22 year old biracial female in to discuss getting nexplanon,already has appt for that in 2 days and has vaginal discharge with odor, no sex in about a month.She lives in [place] now.   Review of Systems +vaginal discharge with odor No sex in a month Reviewed past medical,surgical, social and family history. Reviewed medications and allergies.     Objective:   Physical Exam BP (!) 142/88 (BP Location: Left Arm, Patient Position: Sitting, Cuff Size: Normal)   Pulse 85   Ht 5\' 2"  (1.575 m)   Wt 140 lb (63.5 kg)   LMP 07/28/2017 (Exact Date)   BMI 25.61 kg/m  Skin warm and dry.Pelvic: external genitalia is normal in appearance no lesions, vagina: white discharge with odor,urethra has no lesions or masses noted, cervix:smooth, uterus: normal size, shape and contour, non tender, no masses felt, adnexa: no masses or tenderness noted. Bladder is non tender and no masses felt. Wet prep: + for clue cells and +WBCs.     Assessment:     1. BV (bacterial vaginosis)   2. Vaginal discharge   3. Encounter for other contraceptive management   4. Contraceptive education       Plan:     Check QHCG NO sex Meds ordered this encounter  Medications  . metroNIDAZOLE (METROGEL) 0.75 % vaginal gel    Sig: Place 1 Applicatorful vaginally at bedtime.    Dispense:  70 g    Refill:  0    Order Specific Question:   Supervising Provider    Answer:   Tania Ade H [[id number]]   Return in 2 days for nexplanon insertion,already scheduled  Check insurance on nexplanon

## 2017-08-07 ENCOUNTER — Other Ambulatory Visit: Payer: Self-pay

## 2017-08-07 ENCOUNTER — Encounter: Payer: Self-pay | Admitting: Adult Health

## 2017-08-07 ENCOUNTER — Ambulatory Visit: Payer: 59 | Admitting: Adult Health

## 2017-08-07 VITALS — BP 112/67 | HR 60 | Ht 62.0 in | Wt 141.0 lb

## 2017-08-07 DIAGNOSIS — Z3046 Encounter for surveillance of implantable subdermal contraceptive: Secondary | ICD-10-CM

## 2017-08-07 DIAGNOSIS — Z30017 Encounter for initial prescription of implantable subdermal contraceptive: Secondary | ICD-10-CM

## 2017-08-07 LAB — BETA HCG QUANT (REF LAB)

## 2017-08-07 MED ORDER — ETONOGESTREL 68 MG ~~LOC~~ IMPL
68.0000 mg | DRUG_IMPLANT | Freq: Once | SUBCUTANEOUS | Status: AC
  Administered 2017-08-07: 68 mg via SUBCUTANEOUS

## 2017-08-07 NOTE — Progress Notes (Signed)
  Subjective:     Patient ID: Kaitlyn Garza, female   DOB: 1995/04/26, 21 y.o.   MRN: [id number]  HPI Khristie is a 22 year old biracial female in for nexplanon insertion.  Review of Systems For nexplanon insertion Reviewed past medical,surgical, social and family history. Reviewed medications and allergies.     Objective:   Physical Exam BP 112/67 (BP Location: Right Arm, Patient Position: Sitting, Cuff Size: Normal)   Pulse 60   Ht 5\' 2"  (1.575 m)   Wt 141 lb (64 kg)   LMP 07/28/2017 (Exact Date)   BMI 25.79 kg/m QHCG <1 this am. Consent signed, time out called. Left arm cleansed with betadine, and injected with 1.5 cc 1% lidocaine and waited til numb. Nexplanon easily inserted and steri strips applied.Rod easily palpated by provider and pt. Pressure dressing applied.    Assessment:     nexplanon insertion lot [id number] exp 11/27/2019    Plan:     Use condoms x 2 weeks, keep clean and dry x 24 hours, no heavy lifting, keep steri strips on x 72 hours, Keep pressure dressing on x 24 hours. Follow up prn problems.

## 2017-08-07 NOTE — Addendum Note (Signed)
Addended by: Gaylyn Rong A on: 08/07/2017 03:33 PM   Modules accepted: Orders

## 2017-08-07 NOTE — Patient Instructions (Signed)
Use condoms x 2 weeks, keep clean and dry x 24 hours, no heavy lifting, keep steri strips on x 72 hours, Keep pressure dressing on x 24 hours. Follow up prn problems.  

## 2017-09-15 ENCOUNTER — Emergency Department (HOSPITAL_COMMUNITY): Payer: Self-pay

## 2017-09-15 ENCOUNTER — Other Ambulatory Visit: Payer: Self-pay

## 2017-09-15 ENCOUNTER — Encounter (HOSPITAL_COMMUNITY): Payer: Self-pay | Admitting: Emergency Medicine

## 2017-09-15 ENCOUNTER — Emergency Department (HOSPITAL_COMMUNITY)
Admission: EM | Admit: 2017-09-15 | Discharge: 2017-09-15 | Disposition: A | Discharge status: Home / Self Care | Payer: Self-pay | Attending: Emergency Medicine | Admitting: Emergency Medicine

## 2017-09-15 DIAGNOSIS — F17210 Nicotine dependence, cigarettes, uncomplicated: Secondary | ICD-10-CM | POA: Insufficient documentation

## 2017-09-15 DIAGNOSIS — K529 Noninfective gastroenteritis and colitis, unspecified: Secondary | ICD-10-CM | POA: Insufficient documentation

## 2017-09-15 DIAGNOSIS — F4321 Adjustment disorder with depressed mood: Secondary | ICD-10-CM | POA: Insufficient documentation

## 2017-09-15 DIAGNOSIS — I10 Essential (primary) hypertension: Secondary | ICD-10-CM | POA: Insufficient documentation

## 2017-09-15 HISTORY — DX: Essential (primary) hypertension: I10

## 2017-09-15 LAB — COMPREHENSIVE METABOLIC PANEL
ALBUMIN: 4.1 g/dL (ref 3.5–5.0)
ALK PHOS: 87 U/L (ref 38–126)
ALT: 23 U/L (ref 0–44)
AST: 35 U/L (ref 15–41)
Anion gap: 9 (ref 5–15)
BILIRUBIN TOTAL: 0.3 mg/dL (ref 0.3–1.2)
BUN: 15 mg/dL (ref 6–20)
CO2: 26 mmol/L (ref 22–32)
Calcium: 9.4 mg/dL (ref 8.9–10.3)
Chloride: 108 mmol/L (ref 98–111)
Creatinine, Ser: 1 mg/dL (ref 0.44–1.00)
GFR calc Af Amer: >60 mL/min (ref >60)
GFR calc non Af Amer: >60 mL/min (ref >60)
GLUCOSE: 127 mg/dL — AB (ref 70–99)
POTASSIUM: 3.3 mmol/L — AB (ref 3.5–5.1)
Sodium: 143 mmol/L (ref 135–145)
TOTAL PROTEIN: 7.8 g/dL (ref 6.5–8.1)

## 2017-09-15 LAB — CBC
HEMATOCRIT: 38.4 % (ref 36.0–46.0)
Hemoglobin: 12.5 g/dL (ref 12.0–15.0)
MCH: 29.3 pg (ref 26.0–34.0)
MCHC: 32.6 g/dL (ref 30.0–36.0)
MCV: 90.1 fL (ref 78.0–100.0)
Platelets: 374 10*3/uL (ref 150–400)
RBC: 4.26 MIL/uL (ref 3.87–5.11)
RDW: 14.3 % (ref 11.5–15.5)
WBC: 22 10*3/uL — ABNORMAL HIGH (ref 4.0–10.5)

## 2017-09-15 LAB — I-STAT BETA HCG BLOOD, ED (MC, WL, AP ONLY): I-stat hCG, quantitative: <5 m[IU]/mL (ref <5)

## 2017-09-15 LAB — LIPASE, BLOOD: Lipase: 50 U/L (ref 11–51)

## 2017-09-15 MED ORDER — LORAZEPAM 2 MG/ML IJ SOLN
1.0000 mg | Freq: Once | INTRAMUSCULAR | Status: AC
  Administered 2017-09-15: 1 mg via INTRAVENOUS
  Filled 2017-09-15: qty 1

## 2017-09-15 MED ORDER — PROMETHAZINE HCL 25 MG/ML IJ SOLN
12.5000 mg | Freq: Once | INTRAMUSCULAR | Status: AC
  Administered 2017-09-15: 12.5 mg via INTRAVENOUS
  Filled 2017-09-15: qty 1

## 2017-09-15 MED ORDER — ALPRAZOLAM 0.5 MG PO TABS: 0.5000 mg | ORAL_TABLET | Freq: Three times a day (TID) | ORAL | 0 refills | Status: AC | PRN

## 2017-09-15 MED ORDER — KETOROLAC TROMETHAMINE 30 MG/ML IJ SOLN
30.0000 mg | Freq: Once | INTRAMUSCULAR | Status: AC
  Administered 2017-09-15: 30 mg via INTRAVENOUS
  Filled 2017-09-15: qty 1

## 2017-09-15 MED ORDER — IOPAMIDOL (ISOVUE-300) INJECTION 61%
100.0000 mL | Freq: Once | INTRAVENOUS | Status: AC | PRN
  Administered 2017-09-15: 100 mL via INTRAVENOUS

## 2017-09-15 MED ORDER — METRONIDAZOLE 500 MG PO TABS: 500.0000 mg | ORAL_TABLET | Freq: Three times a day (TID) | ORAL | 0 refills | Status: AC

## 2017-09-15 MED ORDER — SODIUM CHLORIDE 0.9 % IV BOLUS
1000.0000 mL | Freq: Once | INTRAVENOUS | Status: AC
  Administered 2017-09-15: 1000 mL via INTRAVENOUS

## 2017-09-15 MED ORDER — ONDANSETRON HCL 4 MG/2ML IJ SOLN
4.0000 mg | Freq: Once | INTRAMUSCULAR | Status: AC | PRN
  Administered 2017-09-15: 4 mg via INTRAVENOUS
  Filled 2017-09-15: qty 2

## 2017-09-15 MED ORDER — IOPAMIDOL (ISOVUE-300) INJECTION 61%
INTRAVENOUS | Status: AC
  Filled 2017-09-15: qty 100

## 2017-09-15 MED ORDER — HALOPERIDOL LACTATE 5 MG/ML IJ SOLN
5.0000 mg | Freq: Once | INTRAMUSCULAR | Status: AC
  Administered 2017-09-15: 5 mg via INTRAVENOUS
  Filled 2017-09-15: qty 1
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
## 2017-09-15 NOTE — ED Triage Notes (Addendum)
Pt's mother reports vomiting and diarrhea x 2 hours.  States patient is taking "a lot" of medicine.  Pt's 24 1/24 year old child died 1 week ago and the funeral is scheduled for today.  Pt reports pain all over.  Pt moaning and restless.

## 2017-09-15 NOTE — Discharge Instructions (Signed)
Xanax as prescribed.  Begin taking Flagyl as prescribed this evening if diarrhea is not improving in the next 2 days.  Return to the emergency department for worsening pain, bloody stool, or other new and concerning symptoms.

## 2017-11-03 IMAGING — NM NM HEPATO W/GB/PHARM/[PERSON_NAME]
2 series · 12 of 12 positions shown · non-contrast
Comparison: Recent ultrasound and CT examinations.

CLINICAL DATA: Abdominal pain common nausea and vomiting.

EXAM:
NUCLEAR MEDICINE HEPATOBILIARY IMAGING WITH GALLBLADDER EF
TECHNIQUE: Sequential images of the abdomen were obtained [DATE] minutes
following intravenous administration of radiopharmaceutical. After
oral ingestion of Ensure, gallbladder ejection fraction was
determined. At 60 min, normal ejection fraction is greater than 33%.
RADIOPHARMACEUTICALS:  5.0 MCi Tc-DDm  Choletec IV

[Series 1: biliary · 3.25mm/px · 6 of 59 frames shown]
[frame 5/59]
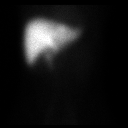
[frame 15/59]
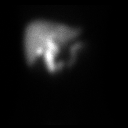
[frame 25/59]
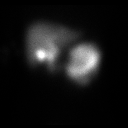
[frame 35/59]
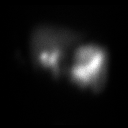
[frame 45/59]
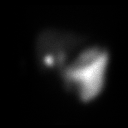
[frame 55/59]
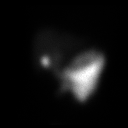

[Series 2: gbef · 3.25mm/px · 6 of 60 frames shown]
[frame 6/60]
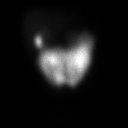
[frame 16/60]
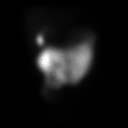
[frame 26/60]
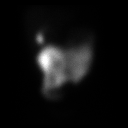
[frame 36/60]
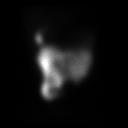
[frame 46/60]
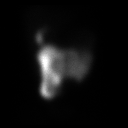
[frame 56/60]
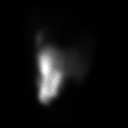

[12 of 12 positions shown; findings below may reference images not displayed]

FINDINGS: Symmetric uptake in the liver with prompt excretion into the biliary
tree which is visualized by 10 minutes. The gallbladder is
visualized at 20 minutes. Activity is seen in the small bowel at 15
minutes.

Calculated gallbladder ejection fraction is 59%. (Normal gallbladder
ejection fraction with Ensure is greater than 33%.)
IMPRESSION: Normal biliary patency study and normal gallbladder ejection
fraction.

## 2018-01-15 DEATH — deceased
